# Patient Record
Sex: Female | Born: 1935
Health system: Southern US, Community
[De-identification: ages and names within clinical notes are randomized; demographics above are authoritative.]

## PROBLEM LIST (undated history)

## (undated) DIAGNOSIS — I10 Essential (primary) hypertension: Secondary | ICD-10-CM

## (undated) DIAGNOSIS — E119 Type 2 diabetes mellitus without complications: Secondary | ICD-10-CM

## (undated) DIAGNOSIS — K219 Gastro-esophageal reflux disease without esophagitis: Secondary | ICD-10-CM

## (undated) DIAGNOSIS — M1990 Unspecified osteoarthritis, unspecified site: Secondary | ICD-10-CM

## (undated) DIAGNOSIS — C50919 Malignant neoplasm of unspecified site of unspecified female breast: Secondary | ICD-10-CM

## (undated) DIAGNOSIS — R51 Headache: Secondary | ICD-10-CM

## (undated) DIAGNOSIS — C801 Malignant (primary) neoplasm, unspecified: Secondary | ICD-10-CM

## (undated) HISTORY — PX: ABDOMINAL HYSTERECTOMY: SHX81

## (undated) HISTORY — PX: SMALL INTESTINE SURGERY: SHX150

## (undated) HISTORY — PX: BREAST EXCISIONAL BIOPSY: SUR124

---

## 1979-12-31 DIAGNOSIS — C801 Malignant (primary) neoplasm, unspecified: Secondary | ICD-10-CM

## 1979-12-31 HISTORY — PX: MASTECTOMY: SHX3

## 1979-12-31 HISTORY — DX: Malignant (primary) neoplasm, unspecified: C80.1

## 1998-08-31 ENCOUNTER — Encounter: Payer: Self-pay | Admitting: Surgery

## 1998-08-31 ENCOUNTER — Ambulatory Visit (HOSPITAL_BASED_OUTPATIENT_CLINIC_OR_DEPARTMENT_OTHER): Admission: RE | Admit: 1998-08-31 | Discharge: 1998-08-31 | Payer: Self-pay | Admitting: Surgery

## 1999-10-19 ENCOUNTER — Ambulatory Visit (HOSPITAL_COMMUNITY): Admission: RE | Admit: 1999-10-19 | Discharge: 1999-10-19 | Payer: Self-pay | Admitting: Gastroenterology

## 1999-10-19 ENCOUNTER — Encounter: Payer: Self-pay | Admitting: Gastroenterology

## 2000-10-06 ENCOUNTER — Encounter: Payer: Self-pay | Admitting: Internal Medicine

## 2000-10-06 ENCOUNTER — Encounter: Admission: RE | Admit: 2000-10-06 | Discharge: 2000-10-06 | Payer: Self-pay | Admitting: Unknown Physician Specialty

## 2001-04-15 ENCOUNTER — Encounter: Admission: RE | Admit: 2001-04-15 | Discharge: 2001-07-14 | Payer: Self-pay | Admitting: Internal Medicine

## 2001-10-26 ENCOUNTER — Encounter: Payer: Self-pay | Admitting: Internal Medicine

## 2001-10-26 ENCOUNTER — Encounter: Admission: RE | Admit: 2001-10-26 | Discharge: 2001-10-26 | Payer: Self-pay | Admitting: Internal Medicine

## 2002-11-15 ENCOUNTER — Encounter: Payer: Self-pay | Admitting: Internal Medicine

## 2002-11-15 ENCOUNTER — Encounter: Admission: RE | Admit: 2002-11-15 | Discharge: 2002-11-15 | Payer: Self-pay | Admitting: Internal Medicine

## 2003-02-03 ENCOUNTER — Encounter: Admission: RE | Admit: 2003-02-03 | Discharge: 2003-02-03 | Payer: Self-pay | Admitting: Internal Medicine

## 2003-02-03 ENCOUNTER — Encounter: Payer: Self-pay | Admitting: Internal Medicine

## 2003-06-26 ENCOUNTER — Encounter: Payer: Self-pay | Admitting: Emergency Medicine

## 2003-06-26 ENCOUNTER — Emergency Department (HOSPITAL_COMMUNITY): Admission: EM | Admit: 2003-06-26 | Discharge: 2003-06-26 | Payer: Self-pay | Admitting: Emergency Medicine

## 2003-12-20 ENCOUNTER — Encounter: Admission: RE | Admit: 2003-12-20 | Discharge: 2003-12-20 | Payer: Self-pay | Admitting: Internal Medicine

## 2005-01-01 ENCOUNTER — Encounter: Admission: RE | Admit: 2005-01-01 | Discharge: 2005-01-01 | Payer: Self-pay | Admitting: Internal Medicine

## 2005-05-09 ENCOUNTER — Encounter: Admission: RE | Admit: 2005-05-09 | Discharge: 2005-05-09 | Payer: Self-pay | Admitting: Obstetrics and Gynecology

## 2005-07-04 ENCOUNTER — Ambulatory Visit: Payer: Self-pay | Admitting: Hematology & Oncology

## 2006-01-21 ENCOUNTER — Encounter: Admission: RE | Admit: 2006-01-21 | Discharge: 2006-01-21 | Payer: Self-pay | Admitting: Internal Medicine

## 2007-02-20 ENCOUNTER — Encounter: Admission: RE | Admit: 2007-02-20 | Discharge: 2007-02-20 | Payer: Self-pay | Admitting: Internal Medicine

## 2007-03-04 ENCOUNTER — Encounter: Admission: RE | Admit: 2007-03-04 | Discharge: 2007-03-04 | Payer: Self-pay | Admitting: Internal Medicine

## 2007-05-26 ENCOUNTER — Encounter: Admission: RE | Admit: 2007-05-26 | Discharge: 2007-05-26 | Payer: Self-pay | Admitting: Obstetrics and Gynecology

## 2008-03-18 ENCOUNTER — Encounter: Admission: RE | Admit: 2008-03-18 | Discharge: 2008-03-18 | Payer: Self-pay | Admitting: Internal Medicine

## 2008-06-17 IMAGING — CT CT PELVIS W/ CM
2 of 6 series · 16 of 46 positions shown, 18 images · IV contrast (READICAT/WATER)
Comparison: Digitized [REDACTED] upper G.I. series, 02/03/03, and [REDACTED] MRCP report, 10/21/99.

CLINICAL DATA: Abdominal pain.    Follow-up renal cyst.
ABDOMEN CT WITHOUT AND WITH CONTRAST:
TECHNIQUE: Multidetector CT imaging of the abdomen was performed both before and during bolus administration of intravenous contrast.
Contrast:  100 cc Omnipaque 300
TECHNIQUE: Multidetector CT imaging of the pelvis was performed following the standard protocol during bolus administration of intravenous contrast.

[Series 4: routine abdomen · axial · 0.74mm/px · z∈[-311,+9]mm · 13 of 74 slices shown, 15 images]
[im 6/74  soft-tissue]
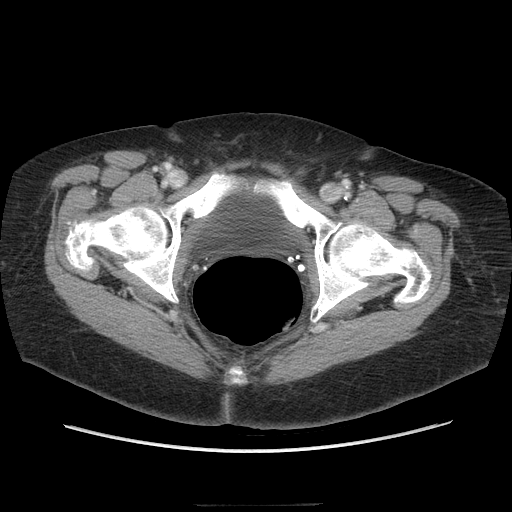
[im 6/74  bone]
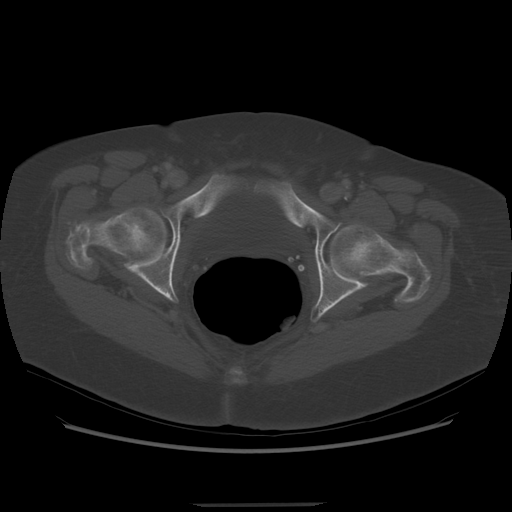
[im 11/74  soft-tissue]
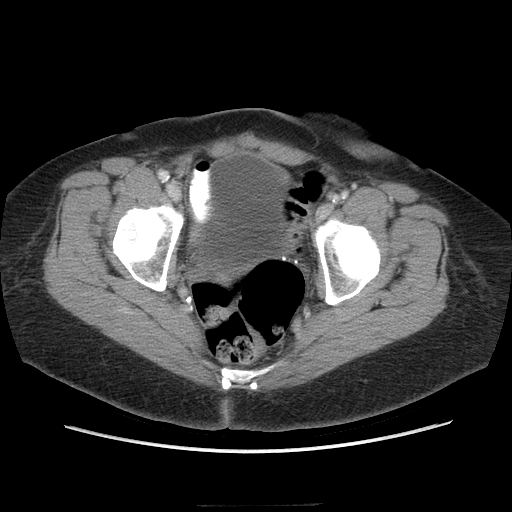
[im 16/74  soft-tissue]
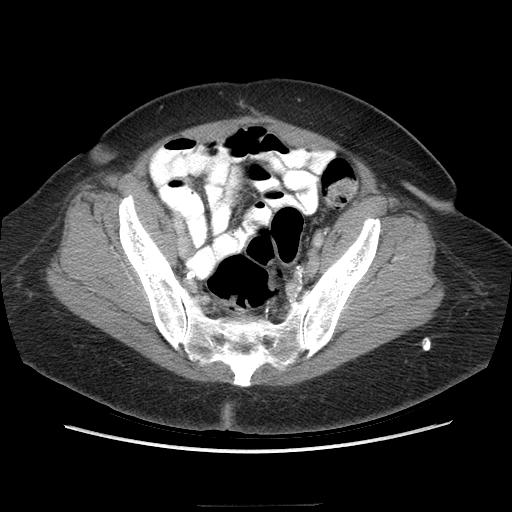
[im 21/74  soft-tissue]
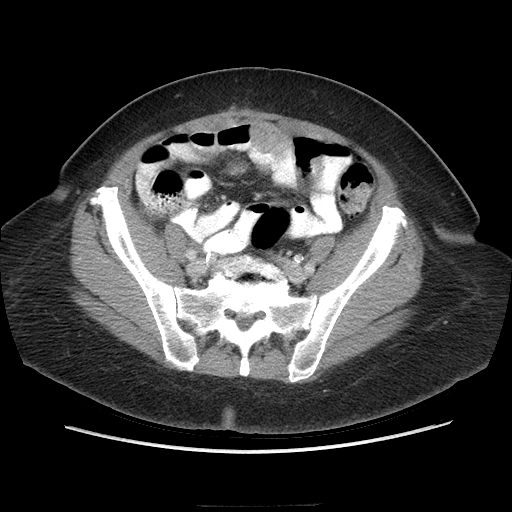
[im 27/74  soft-tissue]
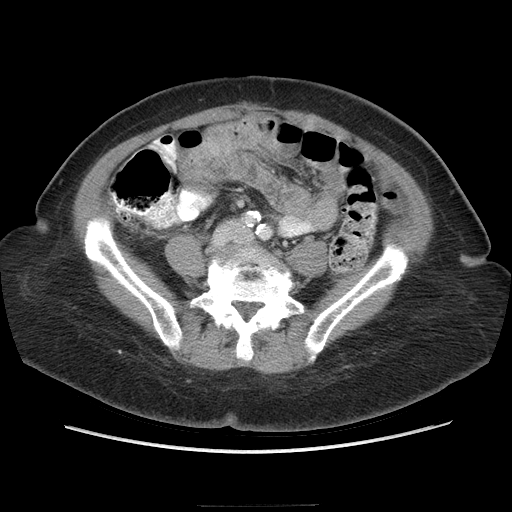
[im 32/74  soft-tissue]
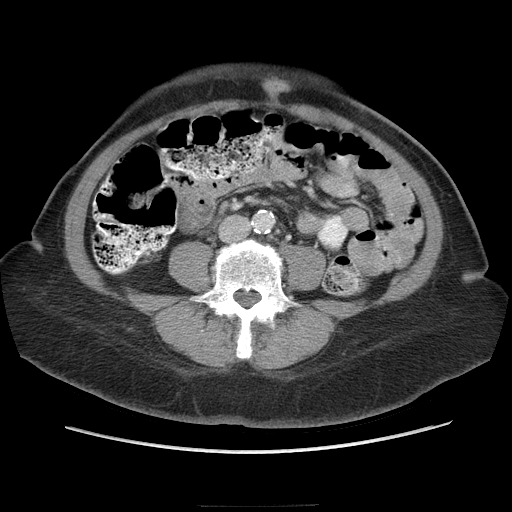
[im 37/74  soft-tissue]
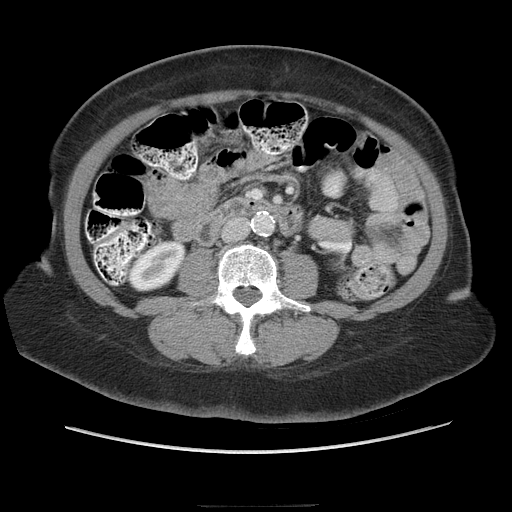
[im 42/74  soft-tissue]
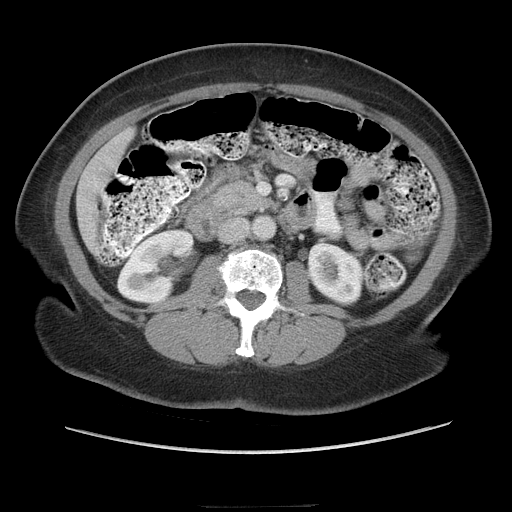
[im 47/74  soft-tissue]
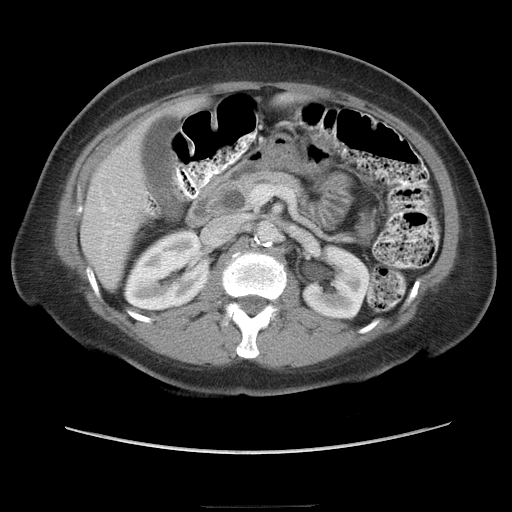
[im 47/74  bone]
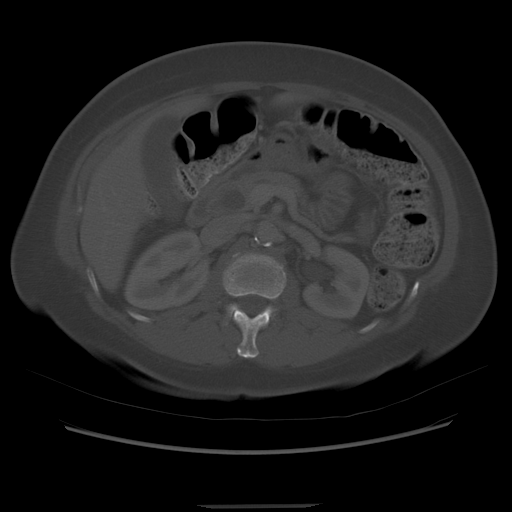
[im 53/74  soft-tissue]
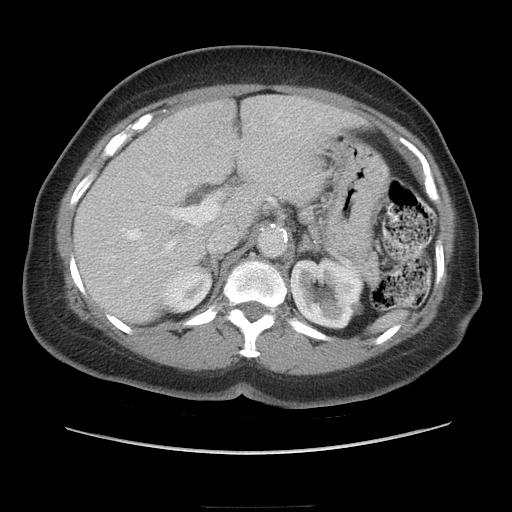
[im 58/74  soft-tissue]
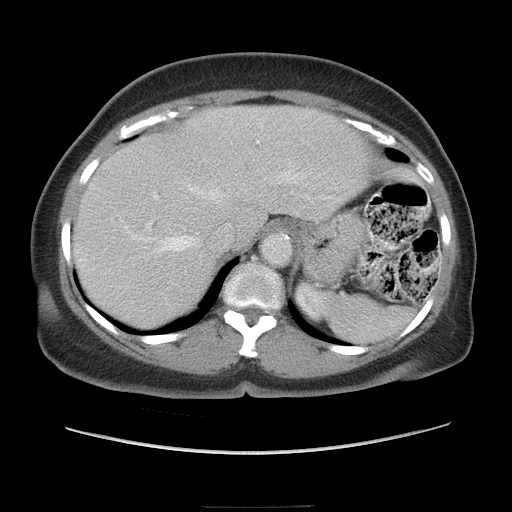
[im 63/74  soft-tissue]
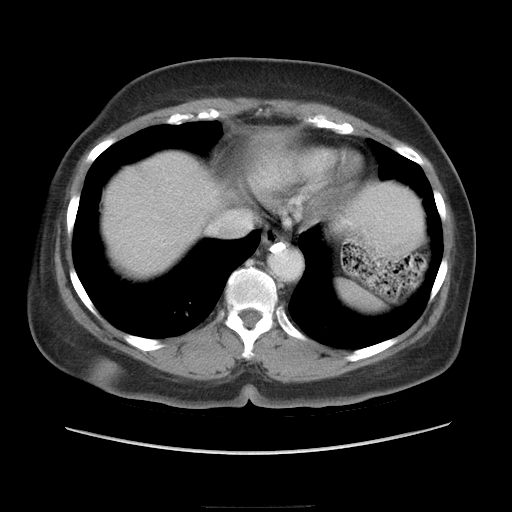
[im 68/74  soft-tissue]
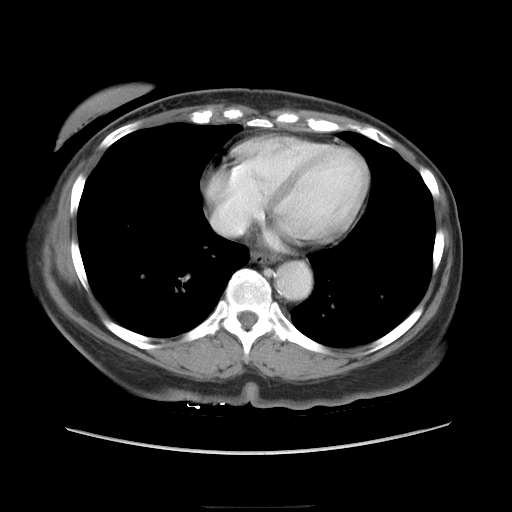

[Series 602: sagittal body · sagittal · 0.79mm/px · 3 of 152 slices shown]
[im 51/152  soft-tissue]
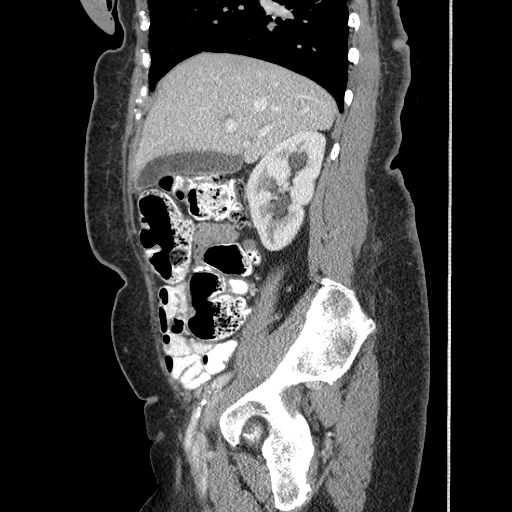
[im 68/152  soft-tissue]
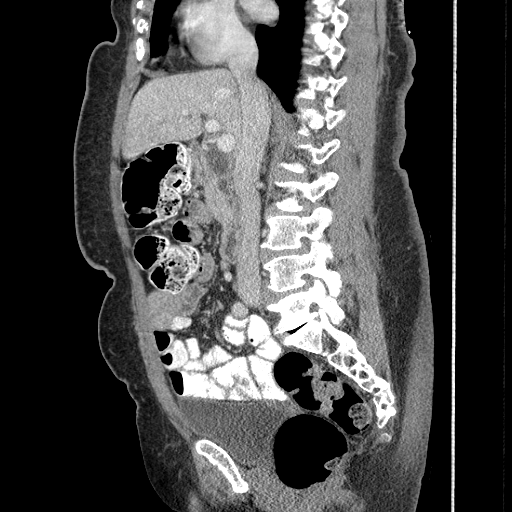
[im 84/152  soft-tissue]
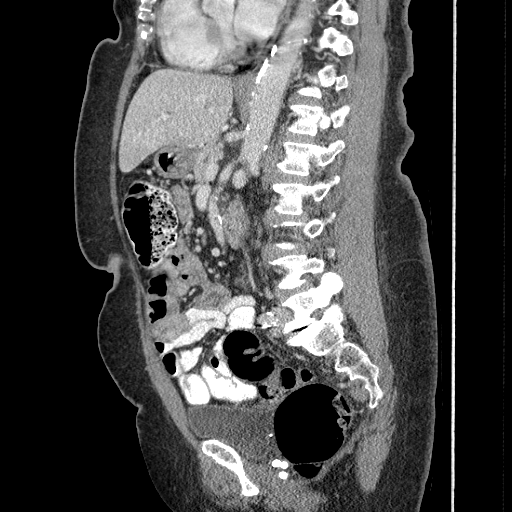

[16 of 46 positions shown; findings below may reference images not displayed]

FINDINGS: appearing multiple left renal cysts--exophytic 10 mm lateral upper to mid, 3 mm upper medial, and 7 mm lower pole left.  Bilateral kidneys are normal in size with no other focal calcification/lesion nor hydronephrosis seen.  Multilevel degenerative disk disease is seen in the lumbar from L2-3 through L5-S1 maximal at latter level.  Slight posterior spondylosis is noted with slight bilateral L5-S1 neural foraminal narrowing.  light fusiform bronchiectasis and linear probable scarring is seen at the right lung base with clustered tiny vasculature or post inflammatory nodules at the right lung base with the lungs otherwise clear.  The patient is post right mastectomy with slight constipation findings without inflammatory bowel disease nor obstruction.  Moderate atheromatous vascular calcification is noted with normal sized abdominal aorta.  The pancreatic duct is slight dilated with no change since prior MRCP report of chronic dilatation of the common hepatic and common bile duct which currently measures up to 18 mm (image 25) and 10 mm AP at its distal course toward the ampulla.  No dilated intrahepatic bile ducts are seen.  The remaining abdominal organs appear normal without inflammation nor adenopathy.
IMPRESSION: 1.  Chronic distention of extrahepatic bile duct with no obstructing lesion identified.
2.  Slight distention pancreatic duct with the pancreas otherwise normal appearing. 
3.  Constipation, slight probable chronic inflammatory changes at the posterior right lung base. 
4.  Atheromatous vascular calcification with degenerative disk disease maximal at L5-S1 with slight bilateral neural foraminal narrowing and moderate facet degenerative joint disease at L5-S1. 
5.  Otherwise negative.
PELVIS CT WITH CONTRAST:
FINDINGS: Moderate atheromatous vascular calcification is seen.  Constipation findings are noted with intestine otherwise unremarkable.  The appendix is not identified with no inflammatory change at the right lower quadrant.  Pessary is in place.  Post hysterectomy status noted.  No free fluid,  inflammation, nor adenopathy is seen.
IMPRESSION: 1.  Post hysterectomy with pessary.
2.  Moderate atheromatous vascular calcification.
3.  Constipation. 
4.  Otherwise negative.

## 2009-03-21 ENCOUNTER — Encounter: Admission: RE | Admit: 2009-03-21 | Discharge: 2009-03-21 | Payer: Self-pay | Admitting: Internal Medicine

## 2010-04-09 ENCOUNTER — Encounter: Admission: RE | Admit: 2010-04-09 | Discharge: 2010-04-09 | Payer: Self-pay | Admitting: Internal Medicine

## 2010-04-16 ENCOUNTER — Encounter: Payer: Self-pay | Admitting: Pulmonary Disease

## 2010-04-23 ENCOUNTER — Encounter: Payer: Self-pay | Admitting: Pulmonary Disease

## 2010-05-08 DIAGNOSIS — K279 Peptic ulcer, site unspecified, unspecified as acute or chronic, without hemorrhage or perforation: Secondary | ICD-10-CM | POA: Insufficient documentation

## 2010-05-08 DIAGNOSIS — C50919 Malignant neoplasm of unspecified site of unspecified female breast: Secondary | ICD-10-CM | POA: Insufficient documentation

## 2010-05-08 DIAGNOSIS — E119 Type 2 diabetes mellitus without complications: Secondary | ICD-10-CM | POA: Insufficient documentation

## 2010-05-08 DIAGNOSIS — M353 Polymyalgia rheumatica: Secondary | ICD-10-CM | POA: Insufficient documentation

## 2010-05-08 DIAGNOSIS — H8110 Benign paroxysmal vertigo, unspecified ear: Secondary | ICD-10-CM | POA: Insufficient documentation

## 2010-05-08 DIAGNOSIS — I10 Essential (primary) hypertension: Secondary | ICD-10-CM | POA: Insufficient documentation

## 2010-05-08 DIAGNOSIS — M1990 Unspecified osteoarthritis, unspecified site: Secondary | ICD-10-CM | POA: Insufficient documentation

## 2010-05-08 DIAGNOSIS — K219 Gastro-esophageal reflux disease without esophagitis: Secondary | ICD-10-CM | POA: Insufficient documentation

## 2010-05-08 DIAGNOSIS — M19019 Primary osteoarthritis, unspecified shoulder: Secondary | ICD-10-CM | POA: Insufficient documentation

## 2010-05-08 DIAGNOSIS — F411 Generalized anxiety disorder: Secondary | ICD-10-CM | POA: Insufficient documentation

## 2010-05-08 DIAGNOSIS — M899 Disorder of bone, unspecified: Secondary | ICD-10-CM | POA: Insufficient documentation

## 2010-05-08 DIAGNOSIS — M949 Disorder of cartilage, unspecified: Secondary | ICD-10-CM

## 2010-05-08 DIAGNOSIS — K2980 Duodenitis without bleeding: Secondary | ICD-10-CM | POA: Insufficient documentation

## 2010-05-08 DIAGNOSIS — N181 Chronic kidney disease, stage 1: Secondary | ICD-10-CM | POA: Insufficient documentation

## 2010-05-08 DIAGNOSIS — Z8719 Personal history of other diseases of the digestive system: Secondary | ICD-10-CM | POA: Insufficient documentation

## 2010-05-09 ENCOUNTER — Ambulatory Visit: Payer: Self-pay | Admitting: Pulmonary Disease

## 2010-05-09 DIAGNOSIS — R059 Cough, unspecified: Secondary | ICD-10-CM | POA: Insufficient documentation

## 2010-05-09 DIAGNOSIS — J449 Chronic obstructive pulmonary disease, unspecified: Secondary | ICD-10-CM | POA: Insufficient documentation

## 2010-05-09 DIAGNOSIS — J4489 Other specified chronic obstructive pulmonary disease: Secondary | ICD-10-CM | POA: Insufficient documentation

## 2010-05-09 DIAGNOSIS — R05 Cough: Secondary | ICD-10-CM

## 2010-05-10 ENCOUNTER — Telehealth (INDEPENDENT_AMBULATORY_CARE_PROVIDER_SITE_OTHER): Payer: Self-pay | Admitting: *Deleted

## 2010-05-10 ENCOUNTER — Telehealth: Payer: Self-pay | Admitting: Pulmonary Disease

## 2010-05-11 ENCOUNTER — Ambulatory Visit: Payer: Self-pay | Admitting: Pulmonary Disease

## 2011-01-29 NOTE — Assessment & Plan Note (Signed)
Summary: persistant [name]   Visit Type:  Initial Consult Copy to:  pcp Primary Provider/Referring Provider:  Dr. Arther Dames  CC:  Pt here for pulmonary consult. Pt c/o having Victoria cough starting August 2010 and stopped x 2 weeks ago. Pt states has been off of Losartan/HCTZ x 4 months.  History of Present Illness: 77/F ex smoker for evaluation of chronic cough. 7/10 quit smoking with gum & patches cough since, productive of 'soap suds', no obvious heartburn, no seasonal or diurnal variation, has subsided over last 2 weeks tessalon perles  Lisinopril changed to losartan in the past. Spirometry >> nml lung function  Preventive Screening-Counseling & Management  Alcohol-Tobacco     Smoking Status: quit     Packs/Day: 1.0     Year Started: 1953     Year Quit: 2010  Current Medications (verified): 1)  Metformin Hcl 500 Mg Tabs (Metformin Hcl) .... Take 1 Tablet By Mouth Two Times A Day 2)  Oysco 500+d 500-200 Mg-Unit Tabs (Calcium Carbonate-Vitamin D) .... Take 1 Tablet By Mouth Once A Day 3)  Xanax 0.5 Mg Tabs (Alprazolam) .... Take 1 Tablet By Mouth Two Times A Day As Needed 4)  Vicodin 5-500 Mg Tabs (Hydrocodone-Acetaminophen) .... Take 1 Tablet By Mouth Two Times A Day As Needed 5)  Felodipine 10 Mg Xr24h-Tab (Felodipine) .... Take 1 Tablet By Mouth Once A Day 6)  Hydrochlorothiazide 25 Mg Tabs (Hydrochlorothiazide) .... Take 1/2 Tablet By Mouth Every Morning 7)  Vitamin D3 5000 Units .... Take 1 Tablet By Mouth Once A Day 8)  Aspirin 81 Mg  Tabs (Aspirin) .... Take 1 Tablet By Mouth Once A Day 9)  Multivitamins   Tabs (Multiple Vitamin) .... Take 1 Tablet By Mouth Once A Day  Allergies (verified): 1)  ! Lisinopril 2)  ! Pcn  Past History:  Past Medical History: Last updated: 2010-06-01 CHRONIC KIDNEY DISEASE STAGE I (ICD-585.1) DEGENERATIVE JOINT DISEASE, BACK (ICD-715.98) OSTEOPENIA (ICD-733.90) PUD (ICD-533.90) BENIGN POSITIONAL VERTIGO  (ICD-386.11) OSTEOARTHRITIS, SHOULDER (ICD-715.91) DIVERTICULITIS, HX OF (ICD-V12.79) ANXIETY (ICD-300.00) DUODENITIS (ICD-535.60) POLYMYALGIA RHEUMATICA (ICD-725) Hx of BREAST CANCER (ICD-174.9) HYPERTENSION (ICD-401.9) G E R D (ICD-530.81) DIABETES, TYPE 2 (ICD-250.00)    Past Surgical History: Last updated: Jun 01, 2010 Mastectomy-Right 1986 Total Abdominal Hysterectomy Cataract Extraction * ADHESION WITH PARTIAL COLOECTOMY  Family History: Last updated: 2010-06-01 unknown Mother: deceased 43 yrs old adopted Pt adopted no siblings, no known family  Social History: Last updated: 05/09/2010 Marital Status: Single, divorced Children: 2 adopted children Occupation: unemployeed, Agricultural consultant at [employer] Patient states former smoker. 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 (former 1ppd) Packs/Day:  1.0  Review of Systems       The patient complains of Victoria cough, irregular heartbeats, acid heartburn, and joint stiffness or pain.  The patient denies shortness of breath with activity, shortness of breath at rest, productive cough, coughing up blood, chest pain, indigestion, loss of appetite, weight change, abdominal pain, difficulty swallowing, sore throat, tooth/dental problems, headaches, nasal congestion/difficulty breathing through nose, sneezing, itching, ear ache, anxiety, depression, hand/feet swelling, rash, change in color of mucus, and fever.    Vital Signs:  Patient profile:   77 year old female Height:      64 inches Weight:      182 pounds BMI:     31.35 O2 Sat:      94 % on Room air Temp:     98.5  degrees F oral Pulse rate:   92 / minute BP sitting:   120 / 80  (left arm) Cuff size:   large  Vitals Entered By: Zackery Barefoot CMA (May 09, 2010 3:30 PM)  O2 Flow:  Room air CC: Pt here for pulmonary consult. Pt c/o having  Victoria cough starting August 2010 and stopped x 2 weeks ago. Pt states has been off of Losartan/HCTZ x 4 months Comments Medications reviewed with patient Verified contact number and pharmacy with patient Zackery Barefoot CMA  May 09, 2010 3:30 PM    Physical Exam  Additional Exam:  Gen. Pleasant, well-nourished, in no distress, normal affect ENT - no lesions, no post nasal drip Neck: No JVD, no thyromegaly, no carotid bruits Lungs: no use of accessory muscles, no dullness to percussion, clear without rales or rhonchi  Cardiovascular: Rhythm regular, heart sounds  normal, no murmurs or gallops, no peripheral edema Abdomen: soft and non-tender, no hepatosplenomegaly, BS normal. Musculoskeletal: No deformities, no cyanosis or clubbing Neuro:  alert, non focal     CXR  Procedure date:  05/09/2010  Findings:      IMPRESSION: Probable nipple shadow projecting over the left costophrenic angle. If desired, repeat exam with nipple marker could be performed.  Pulmonary Function Test Date: 05/09/2010 4:06 PM Gender: Female  Pre-Spirometry FVC    Value: 2.14 L/min   % Pred: 106.90 % FEV1    Value: 1.57 L     Pred: 1.54 L     % Pred: 102.10 % FEV1/FVC  Value: 73.63 %     % Pred: 97.70 %  Impression & Recommendations:  Problem # 1:  COUGH (ICD-786.2) resolved Orders: T-2 View CXR (71020TC) Consultation Level III (16109) Spirometry w/Graph (94010)  Problem # 2:  C O P D (ICD-496) No need for bronchodilators given nml lung function Rpt CXR with nipple markers  Medications Added to Medication List This Visit: 1)  Aspirin 81 Mg Tabs (Aspirin) .... Take 1 tablet by mouth once a day 2)  Multivitamins Tabs (Multiple vitamin) .... Take 1 tablet by mouth once a day  Patient Instructions: 1)  Copy sent to: Dr Donette Larry 2)  Breathing test 3)  A chest x-ray has been recommended.  Your imaging study may require preauthorization.    Immunization History:  Influenza  Immunization History:    Influenza:  historical (10/02/2009)  Pneumovax Immunization History:    Pneumovax:  historical (12/31/2007)     CardioPerfect Spirometry  ID: [id number] Patient: AYERIM, BERQUIST DOB: [birth year] Age: 77 Years Old Sex: Female Race: Black Height: 64 Weight: 182 PPD: 1.0 Status: Unconfirmed Past Medical History:  CHRONIC KIDNEY DISEASE STAGE I (ICD-585.1) DEGENERATIVE JOINT DISEASE, BACK (ICD-715.98) OSTEOPENIA (ICD-733.90) PUD (ICD-533.90) BENIGN POSITIONAL VERTIGO (ICD-386.11) OSTEOARTHRITIS, SHOULDER (ICD-715.91) DIVERTICULITIS, HX OF (ICD-V12.79) ANXIETY (ICD-300.00) DUODENITIS (ICD-535.60) POLYMYALGIA RHEUMATICA (ICD-725) Hx of BREAST CANCER (ICD-174.9) HYPERTENSION (ICD-401.9) G E R D (ICD-530.81) DIABETES, TYPE 2 (ICD-250.00)   Recorded: 05/09/2010 4:06 PM  Parameter  Measured Predicted %Predicted FVC     2.14        2.00        106.90 FEV1     1.57        1.54        102.10 FEV1%   73.63        75.34        97.70 PEF    1.87        3.89        48.20  Interpretation: Normal spirometry

## 2011-01-29 NOTE — Progress Notes (Signed)
Summary: returned call  Phone Note Call from Patient Call back at Home Phone [phone]   Caller: Patient Call For: alva Summary of Call: pt returned call from [place] today. Shanda Bumps r isn't here today).  Initial call taken by: Tivis Ringer, CNA,  May 10, 2010 11:54 AM  Follow-up for Phone Call        Pt informed of Dr Reginia Naas recommendations to repeat cxr with nipple markers.  Pt coming in today to repeat cxr.  Order put in EMR and IDX. Abigail Miyamoto RN  May 10, 2010 12:29 PM

## 2011-01-29 NOTE — Letter (Signed)
Summary: Tyson Dense MD/[facility] Volanda Napoleon MD/[facility]   Imported By: Lester [place] 05/16/2010 10:28:22  _____________________________________________________________________  External Attachment:    Type:   Image     Comment:   External Document

## 2011-01-29 NOTE — Letter (Signed)
Summary: Tyson Dense MD/[facility] Napoleon MD/[facility]   Imported By: Lester [place] 05/16/2010 10:26:19  _____________________________________________________________________  External Attachment:    Type:   Image     Comment:   External Document

## 2011-01-29 NOTE — Progress Notes (Signed)
Summary: repeat xray  Phone Note Call from Patient Call back at Home Phone [phone]   Caller: Patient Call For: alva Reason for Call: Talk to Nurse Complaint: Breathing Problems Summary of Call: Patient asking to speak to nurse about repeat xray scheduled for today.   Initial call taken by: Lehman Prom,  May 10, 2010 12:54 PM  Follow-up for Phone Call        PT thinks that shadow on cxr may be from previous surgery on left breast.  Pt is concerned about having too many xrays.  Please advise if pt needs to procede with repeat cxr.  Informed that pt that Dr Vassie Loll was back in office on 05-11-10. Abigail Miyamoto RN  May 10, 2010 1:03 PM   Pt called back.  Still waiting on phone call from nurse.  Informed pt again that RA not in until this afternoon and would address then.  Pt stated she understood. Follow-up by: Eugene Gavia,  May 11, 2010 10:49 AM  Additional Follow-up for Phone Call Additional follow up Details #1::        Better to take rpt xray to clarify - likley nipple shadow, but rpt xray with markers only way to conform not real spot Additional Follow-up by: Comer Locket. Vassie Loll MD,  May 11, 2010 11:13 AM    Additional Follow-up for Phone Call Additional follow up Details #2::    pt states she will come in today for cxr. order placed. Carron Curie CMA  May 11, 2010 11:18 AM

## 2011-03-15 ENCOUNTER — Other Ambulatory Visit: Payer: Self-pay | Admitting: Internal Medicine

## 2011-03-15 DIAGNOSIS — Z9010 Acquired absence of unspecified breast and nipple: Secondary | ICD-10-CM

## 2011-04-15 ENCOUNTER — Ambulatory Visit
Admission: RE | Admit: 2011-04-15 | Discharge: 2011-04-15 | Disposition: A | Discharge status: Home / Self Care | Payer: Medicare Other | Source: Ambulatory Visit | Attending: Internal Medicine | Admitting: Internal Medicine

## 2011-04-15 DIAGNOSIS — Z9010 Acquired absence of unspecified breast and nipple: Secondary | ICD-10-CM

## 2012-03-13 ENCOUNTER — Other Ambulatory Visit: Payer: Self-pay | Admitting: Internal Medicine

## 2012-03-13 DIAGNOSIS — Z9011 Acquired absence of right breast and nipple: Secondary | ICD-10-CM

## 2012-03-13 DIAGNOSIS — Z1231 Encounter for screening mammogram for malignant neoplasm of breast: Secondary | ICD-10-CM

## 2012-04-15 ENCOUNTER — Ambulatory Visit
Admission: RE | Admit: 2012-04-15 | Discharge: 2012-04-15 | Disposition: A | Discharge status: Home / Self Care | Payer: Medicare Other | Source: Ambulatory Visit | Attending: Internal Medicine | Admitting: Internal Medicine

## 2012-04-15 DIAGNOSIS — Z1231 Encounter for screening mammogram for malignant neoplasm of breast: Secondary | ICD-10-CM

## 2012-04-15 DIAGNOSIS — Z9011 Acquired absence of right breast and nipple: Secondary | ICD-10-CM

## 2013-03-29 ENCOUNTER — Other Ambulatory Visit: Payer: Self-pay

## 2013-03-29 DIAGNOSIS — Z9011 Acquired absence of right breast and nipple: Secondary | ICD-10-CM

## 2013-03-29 DIAGNOSIS — Z1231 Encounter for screening mammogram for malignant neoplasm of breast: Secondary | ICD-10-CM

## 2013-04-29 ENCOUNTER — Ambulatory Visit
Admission: RE | Admit: 2013-04-29 | Discharge: 2013-04-29 | Disposition: A | Discharge status: Home / Self Care | Payer: Medicare Other | Source: Ambulatory Visit

## 2013-04-29 DIAGNOSIS — Z1231 Encounter for screening mammogram for malignant neoplasm of breast: Secondary | ICD-10-CM

## 2013-04-29 DIAGNOSIS — Z9011 Acquired absence of right breast and nipple: Secondary | ICD-10-CM

## 2014-03-23 ENCOUNTER — Other Ambulatory Visit: Payer: Self-pay

## 2014-03-23 DIAGNOSIS — Z1231 Encounter for screening mammogram for malignant neoplasm of breast: Secondary | ICD-10-CM

## 2014-03-23 DIAGNOSIS — Z9011 Acquired absence of right breast and nipple: Secondary | ICD-10-CM

## 2014-05-02 ENCOUNTER — Ambulatory Visit
Admission: RE | Admit: 2014-05-02 | Discharge: 2014-05-02 | Disposition: A | Discharge status: Home / Self Care | Payer: Medicare Other | Source: Ambulatory Visit

## 2014-05-02 ENCOUNTER — Encounter (INDEPENDENT_AMBULATORY_CARE_PROVIDER_SITE_OTHER): Payer: Self-pay

## 2014-05-02 DIAGNOSIS — Z1231 Encounter for screening mammogram for malignant neoplasm of breast: Secondary | ICD-10-CM

## 2014-05-02 DIAGNOSIS — Z9011 Acquired absence of right breast and nipple: Secondary | ICD-10-CM

## 2014-07-18 ENCOUNTER — Other Ambulatory Visit: Payer: Self-pay | Admitting: Gastroenterology

## 2014-08-02 ENCOUNTER — Encounter (HOSPITAL_COMMUNITY): Payer: Self-pay | Admitting: Pharmacy Technician

## 2014-08-05 ENCOUNTER — Encounter (HOSPITAL_COMMUNITY): Payer: Self-pay | Admitting: *Deleted

## 2014-08-22 ENCOUNTER — Encounter (HOSPITAL_COMMUNITY)
Admission: RE | Disposition: A | Discharge status: Home / Self Care | Payer: Self-pay | Source: Ambulatory Visit | Attending: Gastroenterology

## 2014-08-22 ENCOUNTER — Encounter (HOSPITAL_COMMUNITY): Payer: Self-pay | Admitting: *Deleted

## 2014-08-22 ENCOUNTER — Ambulatory Visit (HOSPITAL_COMMUNITY)
Admission: RE | Admit: 2014-08-22 | Discharge: 2014-08-22 | Disposition: A | Discharge status: Home / Self Care | Payer: Medicare Other | Source: Ambulatory Visit | Attending: Gastroenterology | Admitting: Gastroenterology

## 2014-08-22 ENCOUNTER — Encounter (HOSPITAL_COMMUNITY): Payer: Medicare Other | Admitting: Anesthesiology

## 2014-08-22 ENCOUNTER — Ambulatory Visit (HOSPITAL_COMMUNITY): Payer: Medicare Other | Admitting: Anesthesiology

## 2014-08-22 DIAGNOSIS — K5900 Constipation, unspecified: Secondary | ICD-10-CM | POA: Diagnosis not present

## 2014-08-22 DIAGNOSIS — E119 Type 2 diabetes mellitus without complications: Secondary | ICD-10-CM | POA: Diagnosis not present

## 2014-08-22 DIAGNOSIS — M353 Polymyalgia rheumatica: Secondary | ICD-10-CM | POA: Insufficient documentation

## 2014-08-22 DIAGNOSIS — K5730 Diverticulosis of large intestine without perforation or abscess without bleeding: Secondary | ICD-10-CM | POA: Diagnosis not present

## 2014-08-22 DIAGNOSIS — I10 Essential (primary) hypertension: Secondary | ICD-10-CM | POA: Insufficient documentation

## 2014-08-22 DIAGNOSIS — Z888 Allergy status to other drugs, medicaments and biological substances status: Secondary | ICD-10-CM | POA: Insufficient documentation

## 2014-08-22 DIAGNOSIS — Z880 Allergy status to penicillin: Secondary | ICD-10-CM | POA: Insufficient documentation

## 2014-08-22 DIAGNOSIS — Z853 Personal history of malignant neoplasm of breast: Secondary | ICD-10-CM | POA: Diagnosis not present

## 2014-08-22 DIAGNOSIS — Z87891 Personal history of nicotine dependence: Secondary | ICD-10-CM | POA: Diagnosis not present

## 2014-08-22 DIAGNOSIS — K219 Gastro-esophageal reflux disease without esophagitis: Secondary | ICD-10-CM | POA: Diagnosis not present

## 2014-08-22 DIAGNOSIS — K279 Peptic ulcer, site unspecified, unspecified as acute or chronic, without hemorrhage or perforation: Secondary | ICD-10-CM | POA: Diagnosis not present

## 2014-08-22 DIAGNOSIS — K648 Other hemorrhoids: Secondary | ICD-10-CM | POA: Diagnosis not present

## 2014-08-22 DIAGNOSIS — Z1211 Encounter for screening for malignant neoplasm of colon: Secondary | ICD-10-CM | POA: Diagnosis present

## 2014-08-22 HISTORY — DX: Essential (primary) hypertension: I10

## 2014-08-22 HISTORY — DX: Malignant (primary) neoplasm, unspecified: C80.1

## 2014-08-22 HISTORY — DX: Unspecified osteoarthritis, unspecified site: M19.90

## 2014-08-22 HISTORY — DX: Type 2 diabetes mellitus without complications: E11.9

## 2014-08-22 HISTORY — DX: Headache: R51

## 2014-08-22 HISTORY — PX: COLONOSCOPY WITH PROPOFOL: SHX5780

## 2014-08-22 HISTORY — DX: Gastro-esophageal reflux disease without esophagitis: K21.9

## 2014-08-22 LAB — GLUCOSE, CAPILLARY: Glucose-Capillary: 82 mg/dL (ref 70–99)

## 2014-08-22 SURGERY — COLONOSCOPY WITH PROPOFOL
Anesthesia: Monitor Anesthesia Care

## 2014-08-22 MED ORDER — PROPOFOL 10 MG/ML IV BOLUS
INTRAVENOUS | Status: DC | PRN
  Administered 2014-08-22 (×2): 40 mg via INTRAVENOUS

## 2014-08-22 MED ORDER — PROPOFOL 10 MG/ML IV BOLUS
INTRAVENOUS | Status: AC
  Filled 2014-08-22: qty 20

## 2014-08-22 MED ORDER — LACTATED RINGERS IV SOLN
INTRAVENOUS | Status: DC | PRN
  Administered 2014-08-22: 13:00:00 via INTRAVENOUS

## 2014-08-22 MED ORDER — LACTATED RINGERS IV SOLN
INTRAVENOUS | Status: DC
  Administered 2014-08-22: 1000 mL via INTRAVENOUS

## 2014-08-22 MED ORDER — SODIUM CHLORIDE 0.9 % IV SOLN: INTRAVENOUS | Status: DC

## 2014-08-22 MED ORDER — PROPOFOL INFUSION 10 MG/ML OPTIME
INTRAVENOUS | Status: DC | PRN
  Administered 2014-08-22: 75 ug/kg/min via INTRAVENOUS

## 2014-08-22 SURGICAL SUPPLY — 21 items

## 2014-08-22 NOTE — Anesthesia Postprocedure Evaluation (Signed)
  Anesthesia Post-op Note  Patient: Victoria [name]  Procedure(s) Performed: Procedure(s) (LRB): COLONOSCOPY WITH PROPOFOL (N/A)  Patient Location: PACU  Anesthesia Type: MAC  Level of Consciousness: awake and alert   Airway and Oxygen Therapy: Patient Spontanous Breathing  Post-op Pain: mild  Post-op Assessment: Post-op Vital signs reviewed, Patient's Cardiovascular Status Stable, Respiratory Function Stable, Patent Airway and No signs of Nausea or vomiting  Last Vitals:  Filed Vitals:   08/22/14 1410  BP: 159/84  Pulse: 71  Temp:   Resp: 14    Post-op Vital Signs: stable   Complications: No apparent anesthesia complications

## 2014-08-22 NOTE — Discharge Instructions (Signed)
Colonoscopy, Care After °These instructions give you information on caring for yourself after your procedure. Your doctor may also give you more specific instructions. Call your doctor if you have any problems or questions after your procedure. °HOME CARE °· Do not drive for 24 hours. °· Do not sign important papers or use machinery for 24 hours. °· You may shower. °· You may go back to your usual activities, but go slower for the first 24 hours. °· Take rest breaks often during the first 24 hours. °· Walk around or use warm packs on your belly (abdomen) if you have belly cramping or gas. °· Drink enough fluids to keep your pee (urine) clear or pale yellow. °· Resume your normal diet. Avoid heavy or fried foods. °· Avoid drinking alcohol for 24 hours or as told by your doctor. °· Only take medicines as told by your doctor. °If a tissue sample (biopsy) was taken during the procedure:  °· Do not take aspirin or blood thinners for 7 days, or as told by your doctor. °· Do not drink alcohol for 7 days, or as told by your doctor. °· Eat soft foods for the first 24 hours. °GET HELP IF: °You still have a small amount of blood in your poop (stool) 2-3 days after the procedure. °GET HELP RIGHT AWAY IF: °· You have more than a small amount of blood in your poop. °· You see clumps of tissue (blood clots) in your poop. °· Your belly is puffy (swollen). °· You feel sick to your stomach (nauseous) or throw up (vomit). °· You have a fever. °· You have belly pain that gets worse and medicine does not help. °MAKE SURE YOU: °· Understand these instructions. °· Will watch your condition. °· Will get help right away if you are not doing well or get worse. °Document Released: 01/18/2011 Document Revised: 12/21/2013 Document Reviewed: 08/23/2013 °ExitCare® Patient Information ©2015 [employer]. This information is not intended to replace advice given to you by your health care provider. Make sure you discuss any questions you have with  your health care provider. ° °

## 2014-08-22 NOTE — Anesthesia Preprocedure Evaluation (Addendum)
Anesthesia Evaluation  Patient identified by MRN, date of birth, ID band Patient awake    Reviewed: Allergy & Precautions, H&P , NPO status , Patient's Chart, lab work & pertinent test results  Airway Mallampati: II TM Distance: >3 FB Neck ROM: full    Dental  (+) Chipped, Dental Advisory Given Right upper front chipped badly:   Pulmonary COPDformer smoker,  breath sounds clear to auscultation  Pulmonary exam normal       Cardiovascular Exercise Tolerance: Good hypertension, negative cardio ROS  Rhythm:regular Rate:Normal     Neuro/Psych negative neurological ROS  negative psych ROS   GI/Hepatic negative GI ROS, Neg liver ROS, PUD, GERD-  Medicated and Controlled,  Endo/Other  diabetes, Well Controlled, Type 2, Oral Hypoglycemic Agents  Renal/GU Renal diseaseStage 1 kidney disease  negative genitourinary   Musculoskeletal   Abdominal   Peds  Hematology negative hematology ROS (+)   Anesthesia Other Findings   Reproductive/Obstetrics negative OB ROS                          Anesthesia Physical Anesthesia Plan  ASA: III  Anesthesia Plan: MAC   Post-op Pain Management:    Induction:   Airway Management Planned:   Additional Equipment:   Intra-op Plan:   Post-operative Plan:   Informed Consent: I have reviewed the patients History and Physical, chart, labs and discussed the procedure including the risks, benefits and alternatives for the proposed anesthesia with the patient or authorized representative who has indicated his/her understanding and acceptance.   Dental Advisory Given  Plan Discussed with: CRNA and Surgeon  Anesthesia Plan Comments:         Anesthesia Quick Evaluation

## 2014-08-22 NOTE — Op Note (Signed)
Problem: Hemoccult-positive stool associated with normal hemoglobin. Normal screening colonoscopy performed 07/04/2004  Endoscopist: Earle Gell  Premedication: Propofol administered by anesthesia  Procedure: Diagnostic colonoscopy The patient was placed in the left lateral decubitus position. Anal inspection and digital rectal exam were normal. The Pentax pediatric colonoscope was introduced into the rectum and advanced to the cecum. A normal-appearing appendiceal orifice and ileocecal valve were identified. Colonic preparation for the exam today was good.  The patient has universal colonic diverticulosis  Rectum. Normal. Retroflexed view of the distal rectum showed moderate sized nonbleeding  Sigmoid colon and descending colon. Normal  Splenic flexure. Normal  Transverse colon. Normal  Hepatic flexure. Normal  Ascending colon. Normal  Cecum and ileocecal valve. Normal  Assessment:  #1. Universal colonic diverticulosis  #2. Moderate sized nonbleeding internal hemorrhoids  #3. Otherwise normal diagnostic colonoscopy

## 2014-08-22 NOTE — Transfer of Care (Signed)
Immediate Anesthesia Transfer of Care Note  Patient: Victoria [name]  Procedure(s) Performed: Procedure(s) (LRB): COLONOSCOPY WITH PROPOFOL (N/A)  Patient Location: PACU  Anesthesia Type: MAC  Level of Consciousness: sedated, patient cooperative and responds to stimulation  Airway & Oxygen Therapy: Patient Spontanous Breathing and Patient connected to face mask oxgen  Post-op Assessment: Report given to PACU RN and Post -op Vital signs reviewed and stable  Post vital signs: Reviewed and stable  Complications: No apparent anesthesia complications

## 2014-08-22 NOTE — H&P (Signed)
  Problem: Heme positive stool. Normal CBC performed on 05/10/2014. Normal screening colonoscopy performed on 7/6 last 2005  History: The patient is an 80 year old female born June 29, 1927. She submitted stool for Hemoccult testing as part of her health maintenance physical exam. Her stool returned heme positive. Her hemoglobin was normal. She reports no gastrointestinal bleeding or abdominal pain. She takes MiraLAX to control her chronic constipation.  The patient is scheduled to undergo diagnostic colonoscopy.  Past medical history: Type 2 diabetes mellitus. Hypertension. Breast cancer surgery in 19 6. Polymyalgia rheumatica. Gastroesophageal reflux. Colonic diverticulosis. Osteoarthritis. Peptic ulcer disease. Osteopenia. Stage to chronic kidney disease. Total abdominal hysterectomy. Cataract surgery. Adhesion lysis with partial colectomy.  Medication allergies: Lisinopril causes cough. Penicillin causes cough. Felodipine causes cough.  Exam: The patient is alert and lying comfortably on the endoscopy stretcher. Lungs are clear to auscultation. Cardiac exam reveals a regular rhythm. Abdomen is soft and nontender to palpation.  Plan: Proceed with diagnostic colonoscopy

## 2014-08-23 ENCOUNTER — Encounter (HOSPITAL_COMMUNITY): Payer: Self-pay | Admitting: Gastroenterology

## 2015-03-10 ENCOUNTER — Other Ambulatory Visit: Payer: Self-pay | Admitting: Internal Medicine

## 2015-03-10 DIAGNOSIS — R0989 Other specified symptoms and signs involving the circulatory and respiratory systems: Secondary | ICD-10-CM

## 2015-03-14 ENCOUNTER — Ambulatory Visit
Admission: RE | Admit: 2015-03-14 | Discharge: 2015-03-14 | Disposition: A | Discharge status: Home / Self Care | Payer: Medicare Other | Source: Ambulatory Visit | Attending: Internal Medicine | Admitting: Internal Medicine

## 2015-03-14 DIAGNOSIS — R0989 Other specified symptoms and signs involving the circulatory and respiratory systems: Secondary | ICD-10-CM

## 2015-07-18 ENCOUNTER — Other Ambulatory Visit: Payer: Self-pay | Admitting: Internal Medicine

## 2015-07-18 DIAGNOSIS — Z1231 Encounter for screening mammogram for malignant neoplasm of breast: Secondary | ICD-10-CM

## 2015-07-27 ENCOUNTER — Ambulatory Visit
Admission: RE | Admit: 2015-07-27 | Discharge: 2015-07-27 | Disposition: A | Discharge status: Home / Self Care | Payer: Medicare Other | Source: Ambulatory Visit | Attending: Internal Medicine | Admitting: Internal Medicine

## 2015-07-27 DIAGNOSIS — Z1231 Encounter for screening mammogram for malignant neoplasm of breast: Secondary | ICD-10-CM

## 2015-12-26 ENCOUNTER — Other Ambulatory Visit: Payer: Self-pay | Admitting: Internal Medicine

## 2015-12-26 ENCOUNTER — Ambulatory Visit
Admission: RE | Admit: 2015-12-26 | Discharge: 2015-12-26 | Disposition: A | Discharge status: Home / Self Care | Payer: Medicare Other | Source: Ambulatory Visit | Attending: Internal Medicine | Admitting: Internal Medicine

## 2015-12-26 DIAGNOSIS — S93401A Sprain of unspecified ligament of right ankle, initial encounter: Secondary | ICD-10-CM

## 2016-03-22 DIAGNOSIS — E1122 Type 2 diabetes mellitus with diabetic chronic kidney disease: Secondary | ICD-10-CM | POA: Diagnosis not present

## 2016-03-22 DIAGNOSIS — Z7984 Long term (current) use of oral hypoglycemic drugs: Secondary | ICD-10-CM | POA: Diagnosis not present

## 2016-03-22 DIAGNOSIS — R413 Other amnesia: Secondary | ICD-10-CM | POA: Diagnosis not present

## 2016-06-05 DIAGNOSIS — Z0000 Encounter for general adult medical examination without abnormal findings: Secondary | ICD-10-CM | POA: Diagnosis not present

## 2016-06-05 DIAGNOSIS — K279 Peptic ulcer, site unspecified, unspecified as acute or chronic, without hemorrhage or perforation: Secondary | ICD-10-CM | POA: Diagnosis not present

## 2016-06-05 DIAGNOSIS — I10 Essential (primary) hypertension: Secondary | ICD-10-CM | POA: Diagnosis not present

## 2016-06-05 DIAGNOSIS — F419 Anxiety disorder, unspecified: Secondary | ICD-10-CM | POA: Diagnosis not present

## 2016-06-05 DIAGNOSIS — M5136 Other intervertebral disc degeneration, lumbar region: Secondary | ICD-10-CM | POA: Diagnosis not present

## 2016-06-05 DIAGNOSIS — C50911 Malignant neoplasm of unspecified site of right female breast: Secondary | ICD-10-CM | POA: Diagnosis not present

## 2016-06-05 DIAGNOSIS — N182 Chronic kidney disease, stage 2 (mild): Secondary | ICD-10-CM | POA: Diagnosis not present

## 2016-06-05 DIAGNOSIS — M810 Age-related osteoporosis without current pathological fracture: Secondary | ICD-10-CM | POA: Diagnosis not present

## 2016-06-05 DIAGNOSIS — E1122 Type 2 diabetes mellitus with diabetic chronic kidney disease: Secondary | ICD-10-CM | POA: Diagnosis not present

## 2016-06-05 DIAGNOSIS — M353 Polymyalgia rheumatica: Secondary | ICD-10-CM | POA: Diagnosis not present

## 2016-07-27 ENCOUNTER — Emergency Department (HOSPITAL_COMMUNITY): Payer: Medicare Other

## 2016-07-27 ENCOUNTER — Emergency Department (HOSPITAL_COMMUNITY)
Admission: EM | Admit: 2016-07-27 | Discharge: 2016-07-27 | Disposition: A | Discharge status: Home / Self Care | Payer: Medicare Other | Attending: Emergency Medicine | Admitting: Emergency Medicine

## 2016-07-27 ENCOUNTER — Encounter (HOSPITAL_COMMUNITY): Payer: Self-pay

## 2016-07-27 DIAGNOSIS — Z87891 Personal history of nicotine dependence: Secondary | ICD-10-CM | POA: Insufficient documentation

## 2016-07-27 DIAGNOSIS — R05 Cough: Secondary | ICD-10-CM | POA: Diagnosis not present

## 2016-07-27 DIAGNOSIS — E119 Type 2 diabetes mellitus without complications: Secondary | ICD-10-CM | POA: Diagnosis not present

## 2016-07-27 DIAGNOSIS — Z7982 Long term (current) use of aspirin: Secondary | ICD-10-CM | POA: Insufficient documentation

## 2016-07-27 DIAGNOSIS — I10 Essential (primary) hypertension: Secondary | ICD-10-CM | POA: Diagnosis not present

## 2016-07-27 DIAGNOSIS — R059 Cough, unspecified: Secondary | ICD-10-CM

## 2016-07-27 DIAGNOSIS — Z79899 Other long term (current) drug therapy: Secondary | ICD-10-CM | POA: Diagnosis not present

## 2016-07-27 DIAGNOSIS — R0602 Shortness of breath: Secondary | ICD-10-CM | POA: Diagnosis not present

## 2016-07-27 DIAGNOSIS — Z853 Personal history of malignant neoplasm of breast: Secondary | ICD-10-CM | POA: Diagnosis not present

## 2016-07-27 MED ORDER — GUAIFENESIN ER 600 MG PO TB12: 600.0000 mg | ORAL_TABLET | Freq: Two times a day (BID) | ORAL | 0 refills | Status: AC | PRN

## 2016-07-27 MED ORDER — BENZONATATE 100 MG PO CAPS: 100.0000 mg | ORAL_CAPSULE | Freq: Three times a day (TID) | ORAL | 0 refills | Status: AC

## 2016-07-27 NOTE — ED Provider Notes (Signed)
[facility] DEPT Provider Note   CSN: [id number] Arrival date & time: 07/27/16  [id number]  First Provider Contact:  First MD Initiated Contact with Patient 07/27/16 1148     History   Chief Complaint Cough  HPI  Victoria [name] is an 82 y.o. female with history of breast cancer, DM, HTN who presents to the ED for evaluation of cough. She states she has had a cough productive of clear sputum for the past ~10 days. She states the cough is worse at night. She states she coughs up a "glass worth" of sputum every night. Denies chest pain but she states she does have an area under her left breast that hurts when she presses it. Denies shortness of breath. Denies fever or chills. Denies abdominal pain, n/v/d. She has not tried anything to alleviate her symptoms. Endorses history of cigarette smoking, quit >10 years ago. She states she has never been diagnosed with COPD but it is listed in her problem list.   Past Medical History:  Diagnosis Date  . Arthritis   . Cancer ([place]) 1981   breast , right  . Diabetes mellitus without complication ([place])   . GERD (gastroesophageal reflux disease)   . Headache(784.0)   . Hypertension     Patient Active Problem List   Diagnosis Date Noted  . C O P D 05/09/2010  . COUGH 05/09/2010  . BREAST CANCER 05/08/2010  . DIABETES, TYPE 2 05/08/2010  . ANXIETY 05/08/2010  . BENIGN POSITIONAL VERTIGO 05/08/2010  . HYPERTENSION 05/08/2010  . G E R D 05/08/2010  . PUD 05/08/2010  . DUODENITIS 05/08/2010  . CHRONIC KIDNEY DISEASE STAGE I 05/08/2010  . OSTEOARTHRITIS, SHOULDER 05/08/2010  . DEGENERATIVE JOINT DISEASE, BACK 05/08/2010  . POLYMYALGIA RHEUMATICA 05/08/2010  . OSTEOPENIA 05/08/2010  . DIVERTICULITIS, HX OF 05/08/2010    Past Surgical History:  Procedure Laterality Date  . ABDOMINAL HYSTERECTOMY     complete, took ovaries  . COLONOSCOPY WITH PROPOFOL N/A 08/22/2014   Procedure: COLONOSCOPY WITH PROPOFOL;  Surgeon: Garlan Fair, MD;   Location: [facility];  Service: Endoscopy;  Laterality: N/A;  . MASTECTOMY Right 1981   chemo done, no radiation  . SMALL INTESTINE SURGERY  1950's   1 foot removed    OB History    No data available       Home Medications    Prior to Admission medications   Medication Sig Start Date End Date Taking? Authorizing Provider  aspirin EC 81 MG tablet Take 81 mg by mouth daily.    Historical Provider, MD  Calcium Carbonate-Vitamin D (CALTRATE 600+D PO) Take 1 tablet by mouth daily.    Historical Provider, MD  Cholecalciferol (VITAMIN D-3) 5000 UNITS TABS Take 1 tablet by mouth daily.    Historical Provider, MD  losartan-hydrochlorothiazide (HYZAAR) 100-25 MG per tablet Take 1 tablet by mouth every morning.    Historical Provider, MD  metFORMIN (GLUCOPHAGE) 500 MG tablet Take 500 mg by mouth 2 (two) times daily with a meal.    Historical Provider, MD  mometasone (NASONEX) 50 MCG/ACT nasal spray Place 2 sprays into the nose at bedtime.    Historical Provider, MD  polyethylene glycol (MIRALAX / GLYCOLAX) packet Take 17 g by mouth daily.    Historical Provider, MD    Family History No family history on file.  Social History Social History  Substance Use Topics  . Smoking status: Former Smoker    Packs/day: 1.50    Years: 40.00  Types: Cigarettes    Quit date: 12/30/2008  . Smokeless tobacco: Never Used  . Alcohol use Yes     Comment: very rare     Allergies   Lisinopril and Penicillins   Review of Systems Review of Systems 10 Systems reviewed and are negative for acute change except as noted in the HPI.   Physical Exam Updated Vital Signs BP 157/87   Pulse 84   Temp 98.1 F (36.7 C) (Oral)   Resp 16   SpO2 97%   Physical Exam  Constitutional: She is oriented to person, place, and time.  Elderly but well appearing  HENT:  Right Ear: External ear normal.  Left Ear: External ear normal.  Nose: Nose normal.  Mouth/Throat: Oropharynx is clear and moist. No  oropharyngeal exudate.  Eyes: Conjunctivae and EOM are normal.  Neck: Neck supple.  Cardiovascular: Normal rate, regular rhythm, normal heart sounds and intact distal pulses.   Pulmonary/Chest: Effort normal and breath sounds normal. No respiratory distress. She has no wheezes. She has no rales. She exhibits tenderness.    Abdominal: Soft. Bowel sounds are normal. She exhibits no distension. There is no tenderness. There is no rebound and no guarding.  Musculoskeletal: She exhibits no edema.  Neurological: She is alert and oriented to person, place, and time. No cranial nerve deficit.  Skin: Skin is warm and dry.  Psychiatric: She has a normal mood and affect.  Nursing note and vitals reviewed.    ED Treatments / Results  Labs (all labs ordered are listed, but only abnormal results are displayed) Labs Reviewed - No data to display  EKG  EKG Interpretation  Date/Time:  Saturday July 27 2016 12:00:01 EDT Ventricular Rate:  82 PR Interval:    QRS Duration: 81 QT Interval:  369 QTC Calculation: 431 R Axis:   61 Text Interpretation:  Sinus rhythm Abnormal R-wave progression, early transition no acute ST/T changes No old tracing to compare Confirmed by GOLDSTON MD, SCOTT [phone]) on 07/27/2016 12:28:43 PM       Radiology Dg Chest 2 View  Result Date: 07/27/2016 CLINICAL DATA:  Cough with shortness of breath for 1 day. EXAM: CHEST  2 VIEW COMPARISON:  08/13/2011 and prior radiographs dating back to 04/15/2005 FINDINGS: The cardiopericardial silhouette is unremarkable. Fullness of the right hilum is unchanged from 2006. COPD/emphysema changes noted. There is no evidence of focal airspace disease, pulmonary edema, suspicious pulmonary nodule/mass, pleural effusion, or pneumothorax. No acute bony abnormalities are identified. IMPRESSION: COPD/emphysema without evidence of acute cardiopulmonary disease. Electronically Signed   By: Margarette Canada M.D.   On: 07/27/2016  10:41   Procedures Procedures (including critical care time)  Medications Ordered in ED Medications - No data to display   Initial Impression / Assessment and Plan / ED Course  I have reviewed the triage vital signs and the nursing notes.  Pertinent labs & imaging results that were available during my care of the patient were reviewed by me and considered in my medical decision making (see chart for details).  Clinical Course    Pt presenting with cough for the past 10 days, productive of clear sputum. She is afebrile with stable and unremarkable vital signs. Clinically she appears well. Her lungs are CTAB. She has no increased WOB or tachypnea. Her CXR reveals COPD/emphysema; she states she has never been diagnosed with COPD before but it is in her active problem list. Likely mild COPD exacerbation, possibly due to viral URI. EKG nonacute. She has  some chest wall tenderness likely from coughing so much. Will d/c home with rx for supportive cough medicine. PCP is Dr. Lysle Rubens. Encouraged close f/u, possible PFTs as an outpatient. ER return precautions given.  Final Clinical Impressions(s) / ED Diagnoses   Final diagnoses:  Cough    New Prescriptions New Prescriptions   BENZONATATE (TESSALON) 100 MG CAPSULE    Take 1 capsule (100 mg total) by mouth every 8 (eight) hours.   GUAIFENESIN (MUCINEX) 600 MG 12 HR TABLET    Take 1 tablet (600 mg total) by mouth 2 (two) times daily as needed for to loosen phlegm.     Anne Ng, PA-C 07/27/16 [place], MD 07/27/16 [phone]

## 2016-07-27 NOTE — Discharge Instructions (Signed)
Your chest x-ray today showed evidence of COPD/emphysema. I will give you a couple prescriptions to help with your cough. You do not need antibiotics at this time. Please call Dr. Glenna Durand office to schedule a follow upa ppointment as soon as possible. Return to the ER for new or worsening symptoms.

## 2016-07-27 NOTE — ED Triage Notes (Signed)
Patient here with cough x 10-15 days. Describes the cough as a dry cough. No acute distress. Has taken otc medicine with minimal relief

## 2016-08-07 DIAGNOSIS — H524 Presbyopia: Secondary | ICD-10-CM | POA: Diagnosis not present

## 2016-08-09 DIAGNOSIS — N182 Chronic kidney disease, stage 2 (mild): Secondary | ICD-10-CM | POA: Diagnosis not present

## 2016-08-09 DIAGNOSIS — J449 Chronic obstructive pulmonary disease, unspecified: Secondary | ICD-10-CM | POA: Diagnosis not present

## 2016-08-09 DIAGNOSIS — I129 Hypertensive chronic kidney disease with stage 1 through stage 4 chronic kidney disease, or unspecified chronic kidney disease: Secondary | ICD-10-CM | POA: Diagnosis not present

## 2016-08-09 DIAGNOSIS — R05 Cough: Secondary | ICD-10-CM | POA: Diagnosis not present

## 2016-08-09 DIAGNOSIS — F0390 Unspecified dementia without behavioral disturbance: Secondary | ICD-10-CM | POA: Diagnosis not present

## 2016-08-09 DIAGNOSIS — E1122 Type 2 diabetes mellitus with diabetic chronic kidney disease: Secondary | ICD-10-CM | POA: Diagnosis not present

## 2016-09-11 DIAGNOSIS — Z23 Encounter for immunization: Secondary | ICD-10-CM | POA: Diagnosis not present

## 2016-09-11 DIAGNOSIS — I10 Essential (primary) hypertension: Secondary | ICD-10-CM | POA: Diagnosis not present

## 2016-09-11 DIAGNOSIS — E1122 Type 2 diabetes mellitus with diabetic chronic kidney disease: Secondary | ICD-10-CM | POA: Diagnosis not present

## 2016-09-11 DIAGNOSIS — F0390 Unspecified dementia without behavioral disturbance: Secondary | ICD-10-CM | POA: Diagnosis not present

## 2016-10-09 DIAGNOSIS — E1122 Type 2 diabetes mellitus with diabetic chronic kidney disease: Secondary | ICD-10-CM | POA: Diagnosis not present

## 2016-10-09 DIAGNOSIS — R269 Unspecified abnormalities of gait and mobility: Secondary | ICD-10-CM | POA: Diagnosis not present

## 2016-10-09 DIAGNOSIS — N182 Chronic kidney disease, stage 2 (mild): Secondary | ICD-10-CM | POA: Diagnosis not present

## 2016-10-09 DIAGNOSIS — I739 Peripheral vascular disease, unspecified: Secondary | ICD-10-CM | POA: Diagnosis not present

## 2016-10-09 DIAGNOSIS — F0390 Unspecified dementia without behavioral disturbance: Secondary | ICD-10-CM | POA: Diagnosis not present

## 2016-10-09 DIAGNOSIS — I10 Essential (primary) hypertension: Secondary | ICD-10-CM | POA: Diagnosis not present

## 2016-10-09 DIAGNOSIS — C50911 Malignant neoplasm of unspecified site of right female breast: Secondary | ICD-10-CM | POA: Diagnosis not present

## 2016-10-15 NOTE — Patient Outreach (Deleted)
Referral received from [facility] (Karrar Husain,MD). Patient is not eligible for [facility] CM Services due to insurance type/not on APL. Faxed [facility] back stating patient not eligible

## 2016-10-16 NOTE — Progress Notes (Signed)
This encounter was created in error - please disregard.

## 2016-10-16 NOTE — Addendum Note (Signed)
Addended by: Virgel Manifold on: 10/16/2016 11:21 AM   Modules accepted: Miquel Dunn

## 2016-10-24 DIAGNOSIS — M810 Age-related osteoporosis without current pathological fracture: Secondary | ICD-10-CM | POA: Diagnosis not present

## 2016-10-24 DIAGNOSIS — I10 Essential (primary) hypertension: Secondary | ICD-10-CM | POA: Diagnosis not present

## 2016-10-24 DIAGNOSIS — F0390 Unspecified dementia without behavioral disturbance: Secondary | ICD-10-CM | POA: Diagnosis not present

## 2016-10-24 DIAGNOSIS — E1122 Type 2 diabetes mellitus with diabetic chronic kidney disease: Secondary | ICD-10-CM | POA: Diagnosis not present

## 2016-10-24 DIAGNOSIS — J449 Chronic obstructive pulmonary disease, unspecified: Secondary | ICD-10-CM | POA: Diagnosis not present

## 2016-10-24 DIAGNOSIS — C50911 Malignant neoplasm of unspecified site of right female breast: Secondary | ICD-10-CM | POA: Diagnosis not present

## 2016-10-24 DIAGNOSIS — N182 Chronic kidney disease, stage 2 (mild): Secondary | ICD-10-CM | POA: Diagnosis not present

## 2016-12-05 ENCOUNTER — Other Ambulatory Visit: Payer: Self-pay | Admitting: Internal Medicine

## 2016-12-05 DIAGNOSIS — F0390 Unspecified dementia without behavioral disturbance: Secondary | ICD-10-CM | POA: Diagnosis not present

## 2016-12-05 DIAGNOSIS — C50911 Malignant neoplasm of unspecified site of right female breast: Secondary | ICD-10-CM | POA: Diagnosis not present

## 2016-12-05 DIAGNOSIS — E1122 Type 2 diabetes mellitus with diabetic chronic kidney disease: Secondary | ICD-10-CM | POA: Diagnosis not present

## 2016-12-05 DIAGNOSIS — Z1231 Encounter for screening mammogram for malignant neoplasm of breast: Secondary | ICD-10-CM

## 2016-12-05 DIAGNOSIS — R269 Unspecified abnormalities of gait and mobility: Secondary | ICD-10-CM | POA: Diagnosis not present

## 2016-12-05 DIAGNOSIS — H8110 Benign paroxysmal vertigo, unspecified ear: Secondary | ICD-10-CM | POA: Diagnosis not present

## 2016-12-05 DIAGNOSIS — W19XXXA Unspecified fall, initial encounter: Secondary | ICD-10-CM | POA: Diagnosis not present

## 2016-12-06 ENCOUNTER — Ambulatory Visit
Admission: RE | Admit: 2016-12-06 | Discharge: 2016-12-06 | Disposition: A | Discharge status: Home / Self Care | Payer: Medicare Other | Source: Ambulatory Visit | Attending: Internal Medicine | Admitting: Internal Medicine

## 2016-12-06 DIAGNOSIS — Z1231 Encounter for screening mammogram for malignant neoplasm of breast: Secondary | ICD-10-CM

## 2016-12-06 DIAGNOSIS — E1122 Type 2 diabetes mellitus with diabetic chronic kidney disease: Secondary | ICD-10-CM | POA: Diagnosis not present

## 2016-12-06 DIAGNOSIS — N182 Chronic kidney disease, stage 2 (mild): Secondary | ICD-10-CM | POA: Diagnosis not present

## 2016-12-06 DIAGNOSIS — I129 Hypertensive chronic kidney disease with stage 1 through stage 4 chronic kidney disease, or unspecified chronic kidney disease: Secondary | ICD-10-CM | POA: Diagnosis not present

## 2016-12-06 DIAGNOSIS — M353 Polymyalgia rheumatica: Secondary | ICD-10-CM | POA: Diagnosis not present

## 2016-12-06 DIAGNOSIS — F0390 Unspecified dementia without behavioral disturbance: Secondary | ICD-10-CM | POA: Diagnosis not present

## 2016-12-10 ENCOUNTER — Other Ambulatory Visit: Payer: Self-pay | Admitting: Internal Medicine

## 2016-12-10 DIAGNOSIS — R928 Other abnormal and inconclusive findings on diagnostic imaging of breast: Secondary | ICD-10-CM

## 2016-12-13 ENCOUNTER — Ambulatory Visit
Admission: RE | Admit: 2016-12-13 | Discharge: 2016-12-13 | Disposition: A | Discharge status: Home / Self Care | Payer: Medicare Other | Source: Ambulatory Visit | Attending: Internal Medicine | Admitting: Internal Medicine

## 2016-12-13 DIAGNOSIS — R922 Inconclusive mammogram: Secondary | ICD-10-CM | POA: Diagnosis not present

## 2016-12-13 DIAGNOSIS — R928 Other abnormal and inconclusive findings on diagnostic imaging of breast: Secondary | ICD-10-CM

## 2017-01-24 DIAGNOSIS — C50911 Malignant neoplasm of unspecified site of right female breast: Secondary | ICD-10-CM | POA: Diagnosis not present

## 2017-01-24 DIAGNOSIS — I10 Essential (primary) hypertension: Secondary | ICD-10-CM | POA: Diagnosis not present

## 2017-01-24 DIAGNOSIS — F0390 Unspecified dementia without behavioral disturbance: Secondary | ICD-10-CM | POA: Diagnosis not present

## 2017-01-24 DIAGNOSIS — M810 Age-related osteoporosis without current pathological fracture: Secondary | ICD-10-CM | POA: Diagnosis not present

## 2017-01-24 DIAGNOSIS — J449 Chronic obstructive pulmonary disease, unspecified: Secondary | ICD-10-CM | POA: Diagnosis not present

## 2017-01-24 DIAGNOSIS — N182 Chronic kidney disease, stage 2 (mild): Secondary | ICD-10-CM | POA: Diagnosis not present

## 2017-01-24 DIAGNOSIS — E1122 Type 2 diabetes mellitus with diabetic chronic kidney disease: Secondary | ICD-10-CM | POA: Diagnosis not present

## 2017-02-10 DIAGNOSIS — E1122 Type 2 diabetes mellitus with diabetic chronic kidney disease: Secondary | ICD-10-CM | POA: Diagnosis not present

## 2017-02-10 DIAGNOSIS — I10 Essential (primary) hypertension: Secondary | ICD-10-CM | POA: Diagnosis not present

## 2017-02-10 DIAGNOSIS — C50911 Malignant neoplasm of unspecified site of right female breast: Secondary | ICD-10-CM | POA: Diagnosis not present

## 2017-02-10 DIAGNOSIS — I739 Peripheral vascular disease, unspecified: Secondary | ICD-10-CM | POA: Diagnosis not present

## 2017-04-10 ENCOUNTER — Other Ambulatory Visit: Payer: Self-pay | Admitting: Internal Medicine

## 2017-04-10 DIAGNOSIS — R1084 Generalized abdominal pain: Secondary | ICD-10-CM

## 2017-04-10 DIAGNOSIS — R109 Unspecified abdominal pain: Secondary | ICD-10-CM | POA: Diagnosis not present

## 2017-04-10 DIAGNOSIS — R5383 Other fatigue: Secondary | ICD-10-CM | POA: Diagnosis not present

## 2017-04-10 DIAGNOSIS — R630 Anorexia: Secondary | ICD-10-CM | POA: Diagnosis not present

## 2017-04-14 ENCOUNTER — Ambulatory Visit
Admission: RE | Admit: 2017-04-14 | Discharge: 2017-04-14 | Disposition: A | Discharge status: Home / Self Care | Payer: Medicare Other | Source: Ambulatory Visit | Attending: Internal Medicine | Admitting: Internal Medicine

## 2017-04-14 ENCOUNTER — Other Ambulatory Visit: Payer: Self-pay | Admitting: Internal Medicine

## 2017-04-14 DIAGNOSIS — R1084 Generalized abdominal pain: Secondary | ICD-10-CM

## 2017-05-09 ENCOUNTER — Institutional Professional Consult (permissible substitution): Payer: Medicare Other | Admitting: Internal Medicine

## 2017-05-13 ENCOUNTER — Ambulatory Visit
Admission: RE | Admit: 2017-05-13 | Discharge: 2017-05-13 | Disposition: A | Discharge status: Home / Self Care | Payer: Medicare Other | Source: Ambulatory Visit | Attending: Internal Medicine | Admitting: Internal Medicine

## 2017-05-13 ENCOUNTER — Other Ambulatory Visit: Payer: Self-pay | Admitting: Internal Medicine

## 2017-05-13 DIAGNOSIS — K5900 Constipation, unspecified: Secondary | ICD-10-CM | POA: Diagnosis not present

## 2017-05-13 DIAGNOSIS — R109 Unspecified abdominal pain: Secondary | ICD-10-CM

## 2017-06-09 ENCOUNTER — Ambulatory Visit (INDEPENDENT_AMBULATORY_CARE_PROVIDER_SITE_OTHER)
Admission: RE | Admit: 2017-06-09 | Discharge: 2017-06-09 | Disposition: A | Discharge status: Home / Self Care | Payer: Medicare Other | Source: Ambulatory Visit | Attending: Pulmonary Disease | Admitting: Pulmonary Disease

## 2017-06-09 ENCOUNTER — Encounter: Payer: Self-pay | Admitting: Pulmonary Disease

## 2017-06-09 ENCOUNTER — Ambulatory Visit (INDEPENDENT_AMBULATORY_CARE_PROVIDER_SITE_OTHER): Payer: Medicare Other | Admitting: Pulmonary Disease

## 2017-06-09 VITALS — BP 114/84 | HR 76 | Ht 65.0 in | Wt 149.8 lb

## 2017-06-09 DIAGNOSIS — R0602 Shortness of breath: Secondary | ICD-10-CM

## 2017-06-09 NOTE — Progress Notes (Signed)
Victoria [name]    [id number]    12-25-1927  Primary Care Physician:Husain, Denton Ar, MD  Referring Physician: Wenda Low, MD [facility] [employer] [place] [address], [place] [address]  Chief complaint:  Consult for evaluation of COPD  HPI: 83 year old with history of dementia, breast cancer, diabetes, anxiety, GERD, CAD. She will chest x-ray last year which shows hyperinflation suggestive of COPD. She is here for further evaluation  She denies any dyspnea, wheezing. Chest some cough with sputum production at nighttime. No fevers, chills, hemoptysis. She is not currently on any inhalers.  Outpatient Encounter Prescriptions as of 06/09/2017  Medication Sig  . aspirin EC 81 MG tablet Take 81 mg by mouth daily.  . benzonatate (TESSALON) 100 MG capsule Take 1 capsule (100 mg total) by mouth every 8 (eight) hours.  . Cholecalciferol (VITAMIN D-3) 5000 UNITS TABS Take 1 tablet by mouth daily.  Marland Kitchen guaiFENesin (MUCINEX) 600 MG 12 hr tablet Take 1 tablet (600 mg total) by mouth 2 (two) times daily as needed for to loosen phlegm.  Marland Kitchen losartan-hydrochlorothiazide (HYZAAR) 100-25 MG per tablet Take 1 tablet by mouth every morning.  . mometasone (NASONEX) 50 MCG/ACT nasal spray Place 2 sprays into the nose at bedtime.  . polyethylene glycol (MIRALAX / GLYCOLAX) packet Take 17 g by mouth daily.  . [DISCONTINUED] metFORMIN (GLUCOPHAGE) 500 MG tablet Take 500 mg by mouth 2 (two) times daily with a meal.  . [DISCONTINUED] Calcium Carbonate-Vitamin D (CALTRATE 600+D PO) Take 1 tablet by mouth daily.   No facility-administered encounter medications on file as of 06/09/2017.     Allergies as of 06/09/2017 - Review Complete 06/09/2017  Allergen Reaction Noted  . Lisinopril    . Penicillins      Past Medical History:  Diagnosis Date  . Arthritis   . Cancer ([place]) 1981   breast , right  . Diabetes mellitus without complication ([place])   . GERD (gastroesophageal reflux disease)   .  Headache(784.0)   . Hypertension     Past Surgical History:  Procedure Laterality Date  . ABDOMINAL HYSTERECTOMY     complete, took ovaries  . COLONOSCOPY WITH PROPOFOL N/A 08/22/2014   Procedure: COLONOSCOPY WITH PROPOFOL;  Surgeon: Garlan Fair, MD;  Location: [facility];  Service: Endoscopy;  Laterality: N/A;  . MASTECTOMY Right 1981   chemo done, no radiation  . SMALL INTESTINE SURGERY  1950's   1 foot removed    Family History  Problem Relation Age of Onset  . Family history unknown: Yes    Social History   Social History  . Marital status: Divorced    Spouse name: N/A  . Number of children: N/A  . Years of education: N/A   Occupational History  . Not on file.   Social History Main Topics  . Smoking status: Former Smoker    Packs/day: 1.50    Years: 40.00    Types: Cigarettes    Quit date: 12/30/2008  . Smokeless tobacco: Never Used  . Alcohol use Yes     Comment: very rare  . Drug use: No  . Sexual activity: Not on file   Other Topics Concern  . Not on file   Social History Narrative  . No narrative on file    Review of systems: Review of Systems  Constitutional: Negative for fever and chills.  HENT: Negative.   Eyes: Negative for blurred vision.  Respiratory: as per HPI  Cardiovascular: Negative  for chest pain and palpitations.  Gastrointestinal: Negative for vomiting, diarrhea, blood per rectum. Genitourinary: Negative for dysuria, urgency, frequency and hematuria.  Musculoskeletal: Negative for myalgias, back pain and joint pain.  Skin: Negative for itching and rash.  Neurological: Negative for dizziness, tremors, focal weakness, seizures and loss of consciousness.  Endo/Heme/Allergies: Negative for environmental allergies.  Psychiatric/Behavioral: Negative for depression, suicidal ideas and hallucinations.  All other systems reviewed and are negative.  Physical Exam: Blood pressure 114/84, pulse 76, height 5\' 5"  (1.651 m), weight 149 lb  12.8 oz (67.9 kg), SpO2 99 %. Gen:      No acute distress HEENT:  EOMI, sclera anicteric Neck:     No masses; no thyromegaly Lungs:    Clear to auscultation bilaterally; normal respiratory effort CV:         Regular rate and rhythm; no murmurs Abd:      + bowel sounds; soft, non-tender; no palpable masses, no distension Ext:    No edema; adequate peripheral perfusion Skin:      Warm and dry; no rash Neuro: alert and oriented x 3 Psych: normal mood and affect  Data Reviewed: Chest x-ray 07/27/16-hyperinflation, COPD. No acute changes I have reviewed all images personally.  Assessment:  Evaluation for COPD I reviewed the chest x-ray which shows hyperinflation consistent with COPD. We will schedule pulmonary function tests and a repeat chest x-ray for further evaluation She is currently not on any inhalers and I don't believe she needs to be on any regular medications. We'll reevaluate after getting the PFTs.  Plan/Recommendations: - PFTs, chest x-ray - Observe off inhalers   Marshell Garfinkel MD [facility] Pager [phone] 06/09/2017, 4:38 PM  CC: Wenda Low, MD

## 2017-06-09 NOTE — Patient Instructions (Signed)
We'll schedule for chest x-ray and pulmonary function tests Return to clinic in 3 months.

## 2017-06-25 DIAGNOSIS — C50911 Malignant neoplasm of unspecified site of right female breast: Secondary | ICD-10-CM | POA: Diagnosis not present

## 2017-06-25 DIAGNOSIS — I10 Essential (primary) hypertension: Secondary | ICD-10-CM | POA: Diagnosis not present

## 2017-06-25 DIAGNOSIS — E1122 Type 2 diabetes mellitus with diabetic chronic kidney disease: Secondary | ICD-10-CM | POA: Diagnosis not present

## 2017-06-25 DIAGNOSIS — I739 Peripheral vascular disease, unspecified: Secondary | ICD-10-CM | POA: Diagnosis not present

## 2017-08-07 DIAGNOSIS — H5201 Hypermetropia, right eye: Secondary | ICD-10-CM | POA: Diagnosis not present

## 2017-08-21 DIAGNOSIS — E1122 Type 2 diabetes mellitus with diabetic chronic kidney disease: Secondary | ICD-10-CM | POA: Diagnosis not present

## 2017-08-21 DIAGNOSIS — I739 Peripheral vascular disease, unspecified: Secondary | ICD-10-CM | POA: Diagnosis not present

## 2017-08-21 DIAGNOSIS — F0390 Unspecified dementia without behavioral disturbance: Secondary | ICD-10-CM | POA: Diagnosis not present

## 2017-08-21 DIAGNOSIS — I10 Essential (primary) hypertension: Secondary | ICD-10-CM | POA: Diagnosis not present

## 2017-08-22 DIAGNOSIS — M25562 Pain in left knee: Secondary | ICD-10-CM | POA: Diagnosis not present

## 2017-08-22 DIAGNOSIS — M1712 Unilateral primary osteoarthritis, left knee: Secondary | ICD-10-CM | POA: Diagnosis not present

## 2017-09-02 ENCOUNTER — Ambulatory Visit (INDEPENDENT_AMBULATORY_CARE_PROVIDER_SITE_OTHER): Payer: BC Managed Care – PPO | Admitting: Podiatry

## 2017-09-02 ENCOUNTER — Encounter: Payer: Self-pay | Admitting: Podiatry

## 2017-09-02 VITALS — BP 159/84 | HR 75

## 2017-09-02 DIAGNOSIS — M79674 Pain in right toe(s): Secondary | ICD-10-CM

## 2017-09-02 DIAGNOSIS — E119 Type 2 diabetes mellitus without complications: Secondary | ICD-10-CM

## 2017-09-02 DIAGNOSIS — B351 Tinea unguium: Secondary | ICD-10-CM

## 2017-09-02 DIAGNOSIS — M79675 Pain in left toe(s): Secondary | ICD-10-CM

## 2017-09-02 NOTE — Progress Notes (Signed)
   Subjective:    Patient ID: Victoria [name], female    DOB: Aug 21, 1927, 83 y.o.   MRN: [id number]  HPI this patient presents the office with chief complaint of long thick painful toenails. This patient states the nails are painful walking and wearing her shoes. This patient has been diagnosed as a type II diabetic.  This patient presents the office for preventative foot care services    Review of Systems  Constitutional: Positive for activity change, appetite change, chills, fatigue and fever.  HENT: Positive for ear pain, hearing loss, rhinorrhea, sinus pain, sneezing and sore throat.   Respiratory: Positive for cough and wheezing.   Cardiovascular: Positive for chest pain, palpitations and leg swelling.  Gastrointestinal: Positive for constipation.  Musculoskeletal: Positive for gait problem.       Joint pain  Skin:       change in nails  Neurological: Positive for headaches.       Objective:   Physical Exam GENERAL APPEARANCE: Alert, conversant. Appropriately groomed. No acute distress.  VASCULAR: Pedal pulses are  palpable at  [facility] and PT bilateral.  Capillary refill time is immediate to all digits,  Normal temperature gradient.  Digital hair growth is present bilateral  NEUROLOGIC: sensation is normal to 5.07 monofilament at 5/5 sites bilateral.  Light touch is intact bilateral, Muscle strength normal.  MUSCULOSKELETAL: acceptable muscle strength, tone and stability bilateral.  Intrinsic muscluature intact bilateral.  Rectus appearance of foot and digits noted bilateral.  NAILS  thick disfigured discolored nails with subungual debris noted 10.  No evidence of bacterial infection or drainage DERMATOLOGIC: skin color, texture, and turgor are within normal limits.  No preulcerative lesions or ulcers  are seen, no interdigital maceration noted.  No open lesions present. . No drainage noted.         Assessment & Plan:  Onychomycosis  Diabetes with no complications.   ie  Debride  nails.  RTC 3 months.   Gardiner Barefoot DPM

## 2017-09-16 ENCOUNTER — Ambulatory Visit (INDEPENDENT_AMBULATORY_CARE_PROVIDER_SITE_OTHER): Payer: Medicare Other | Admitting: Pulmonary Disease

## 2017-09-16 ENCOUNTER — Encounter: Payer: Self-pay | Admitting: Pulmonary Disease

## 2017-09-16 VITALS — BP 128/78 | HR 87 | Ht 64.5 in | Wt 142.0 lb

## 2017-09-16 DIAGNOSIS — R0602 Shortness of breath: Secondary | ICD-10-CM

## 2017-09-16 LAB — PULMONARY FUNCTION TEST
DL/VA % PRED: 84 %
DL/VA: 3.96 ml/min/mmHg/L
DLCO UNC % PRED: 60 %
DLCO cor % pred: 64 %
DLCO cor: 14.87 ml/min/mmHg
DLCO unc: 13.87 ml/min/mmHg
FEF 25-75 PRE: 2.54 L/s
FEF 25-75 Post: 1.45 L/sec
FEF2575-%CHANGE-POST: -42 %
FEF2575-%PRED-POST: 154 %
FEF2575-%Pred-Pre: 269 %
FEV1-%CHANGE-POST: -12 %
FEV1-%Pred-Post: 125 %
FEV1-%Pred-Pre: 142 %
FEV1-PRE: 1.76 L
FEV1-Post: 1.55 L
FEV1FVC-%Change-Post: 0 %
FEV1FVC-%PRED-PRE: 118 %
FEV6-%Change-Post: -12 %
FEV6-%Pred-Post: 115 %
FEV6-%Pred-Pre: 132 %
FEV6-Post: 1.75 L
FEV6-Pre: 2.01 L
FEV6FVC-%Pred-Post: 105 %
FEV6FVC-%Pred-Pre: 105 %
FVC-%Change-Post: -12 %
FVC-%PRED-PRE: 125 %
FVC-%Pred-Post: 109 %
FVC-PRE: 2.01 L
FVC-Post: 1.76 L
POST FEV1/FVC RATIO: 88 %
PRE FEV6/FVC RATIO: 100 %
Post FEV6/FVC ratio: 100 %
Pre FEV1/FVC ratio: 88 %
RV % PRED: 61 %
RV: 1.54 L
TLC % pred: 80 %
TLC: 3.96 L

## 2017-09-16 NOTE — Progress Notes (Signed)
Victoria [name]    [id number]    June 28, 1927  Primary Care Physician:Husain, Denton Ar, MD  Referring Physician: Wenda Low, MD [facility] [employer] [place] [address], [place] [address]  Chief complaint:  Follow-up for evaluation of COPD  HPI: 83 year old with history of dementia, breast cancer, diabetes, anxiety, GERD, CAD. She will chest x-ray last year which shows hyperinflation suggestive of COPD. She is here for further evaluation  She denies any dyspnea, wheezing. She has some cough with sputum production at nighttime. No fevers, chills, hemoptysis. She is not currently on any inhalers.  Interim History: She continues to feel well with no symptoms. She's had PFTs done today and is here for review.  Outpatient Encounter Prescriptions as of 09/16/2017  Medication Sig  . aspirin EC 81 MG tablet Take 81 mg by mouth daily.  . benzonatate (TESSALON) 100 MG capsule Take 1 capsule (100 mg total) by mouth every 8 (eight) hours.  . Cholecalciferol (VITAMIN D-3) 5000 UNITS TABS Take 1 tablet by mouth daily.  Marland Kitchen guaiFENesin (MUCINEX) 600 MG 12 hr tablet Take 1 tablet (600 mg total) by mouth 2 (two) times daily as needed for to loosen phlegm.  . mometasone (NASONEX) 50 MCG/ACT nasal spray Place 2 sprays into the nose at bedtime.  Marland Kitchen PANTOPRAZOLE SODIUM PO Take by mouth.  . polyethylene glycol (MIRALAX / GLYCOLAX) packet Take 17 g by mouth daily.  . DONEPEZIL HCL PO Take by mouth.  . losartan-hydrochlorothiazide (HYZAAR) 100-25 MG per tablet Take 1 tablet by mouth every morning.   No facility-administered encounter medications on file as of 09/16/2017.     Allergies as of 09/16/2017 - Review Complete 09/16/2017  Allergen Reaction Noted  . Lisinopril    . Penicillins      Past Medical History:  Diagnosis Date  . Arthritis   . Cancer ([place]) 1981   breast , right  . Diabetes mellitus without complication ([place])   . GERD (gastroesophageal reflux disease)   . Headache(784.0)   .  Hypertension     Past Surgical History:  Procedure Laterality Date  . ABDOMINAL HYSTERECTOMY     complete, took ovaries  . COLONOSCOPY WITH PROPOFOL N/A 08/22/2014   Procedure: COLONOSCOPY WITH PROPOFOL;  Surgeon: Garlan Fair, MD;  [facility];  Service: Endoscopy;  Laterality: N/A;  . MASTECTOMY Right 1981   chemo done, no radiation  . SMALL INTESTINE SURGERY  1950's   1 foot removed    Family History  Problem Relation Age of Onset  . Family history unknown: Yes    Social History   Social History  . Marital status: Divorced    Spouse name: N/A  . Number of children: N/A  . Years of education: N/A   Occupational History  . Not on file.   Social History Main Topics  . Smoking status: Former Smoker    Packs/day: 1.50    Years: 40.00    Types: Cigarettes    Quit date: 12/30/2008  . Smokeless tobacco: Never Used  . Alcohol use Yes     Comment: very rare  . Drug use: No  . Sexual activity: Not on file   Other Topics Concern  . Not on file   Social History Narrative  . No narrative on file    Review of systems: Review of Systems  Constitutional: Negative for fever and chills.  HENT: Negative.   Eyes: Negative for blurred vision.  Respiratory: as per HPI  Cardiovascular: Negative for chest pain and palpitations.  Gastrointestinal: Negative for vomiting, diarrhea, blood per rectum. Genitourinary: Negative for dysuria, urgency, frequency and hematuria.  Musculoskeletal: Negative for myalgias, back pain and joint pain.  Skin: Negative for itching and rash.  Neurological: Negative for dizziness, tremors, focal weakness, seizures and loss of consciousness.  Endo/Heme/Allergies: Negative for environmental allergies.  Psychiatric/Behavioral: Negative for depression, suicidal ideas and hallucinations.  All other systems reviewed and are negative.  Physical Exam: Blood pressure 128/78, pulse 87, height 5' 4.5" (1.638 m), weight 142 lb (64.4 kg), SpO2 98  %. Gen:      No acute distress HEENT:  EOMI, sclera anicteric Neck:     No masses; no thyromegaly Lungs:    Clear to auscultation bilaterally; normal respiratory effort CV:         Regular rate and rhythm; no murmurs Abd:      + bowel sounds; soft, non-tender; no palpable masses, no distension Ext:    No edema; adequate peripheral perfusion Skin:      Warm and dry; no rash Neuro: alert and oriented x 3 Psych: normal mood and affect  Data Reviewed: Chest x-ray 07/27/16-hyperinflation, COPD. No acute changes Chest x-ray 06/09/17- hyperinflation, mild peribronchial thickening. I have reviewed all images personally.  PFTs 09/16/17 FVC 1.76 [109%], FEV1 1.55 [125%],/F 88, TLC 80%, DLCO/VA 84%. Minimal reduction in diffusion capacity which corrects for alveolar volume.  Assessment:  Evaluation for COPD I reviewed the chest x-ray which shows hyperinflation consistent with COPD but PFTs do not show any obstruction. There is mild reduction in diffusion capacity which corrects for alveolar volume. This likely of no clinical significance.  She is currently not on any inhalers and I don't believe she needs to be on any regular medications as she is asymptomatic.  She can follow up with the pulmonary clinic as needed.  Plan/Recommendations: - Follow up as needed  Marshell Garfinkel MD [facility] Pager [phone] 09/16/2017, 1:53 PM  CC: Wenda Low, MD

## 2017-09-16 NOTE — Progress Notes (Signed)
PFT done today. 

## 2017-09-16 NOTE — Patient Instructions (Signed)
I had reviewed the lung function tests. There is no evidence of COPD. I'm glad to feeling better and do not need any inhalers. You can follow up with the clinic as needed. Please call us if there is any change in your symptoms.

## 2017-10-22 DIAGNOSIS — C50911 Malignant neoplasm of unspecified site of right female breast: Secondary | ICD-10-CM | POA: Diagnosis not present

## 2017-10-22 DIAGNOSIS — Z23 Encounter for immunization: Secondary | ICD-10-CM | POA: Diagnosis not present

## 2017-10-22 DIAGNOSIS — E1122 Type 2 diabetes mellitus with diabetic chronic kidney disease: Secondary | ICD-10-CM | POA: Diagnosis not present

## 2017-10-22 DIAGNOSIS — I10 Essential (primary) hypertension: Secondary | ICD-10-CM | POA: Diagnosis not present

## 2017-11-22 IMAGING — CR DG CHEST 2V
2 series · 2 of 2 positions shown · non-contrast
Comparison: 08/13/2011 and prior radiographs dating back to
04/15/2005

CLINICAL DATA: Cough with shortness of breath for 1 day.

EXAM:
CHEST  2 VIEW

[chest pa]
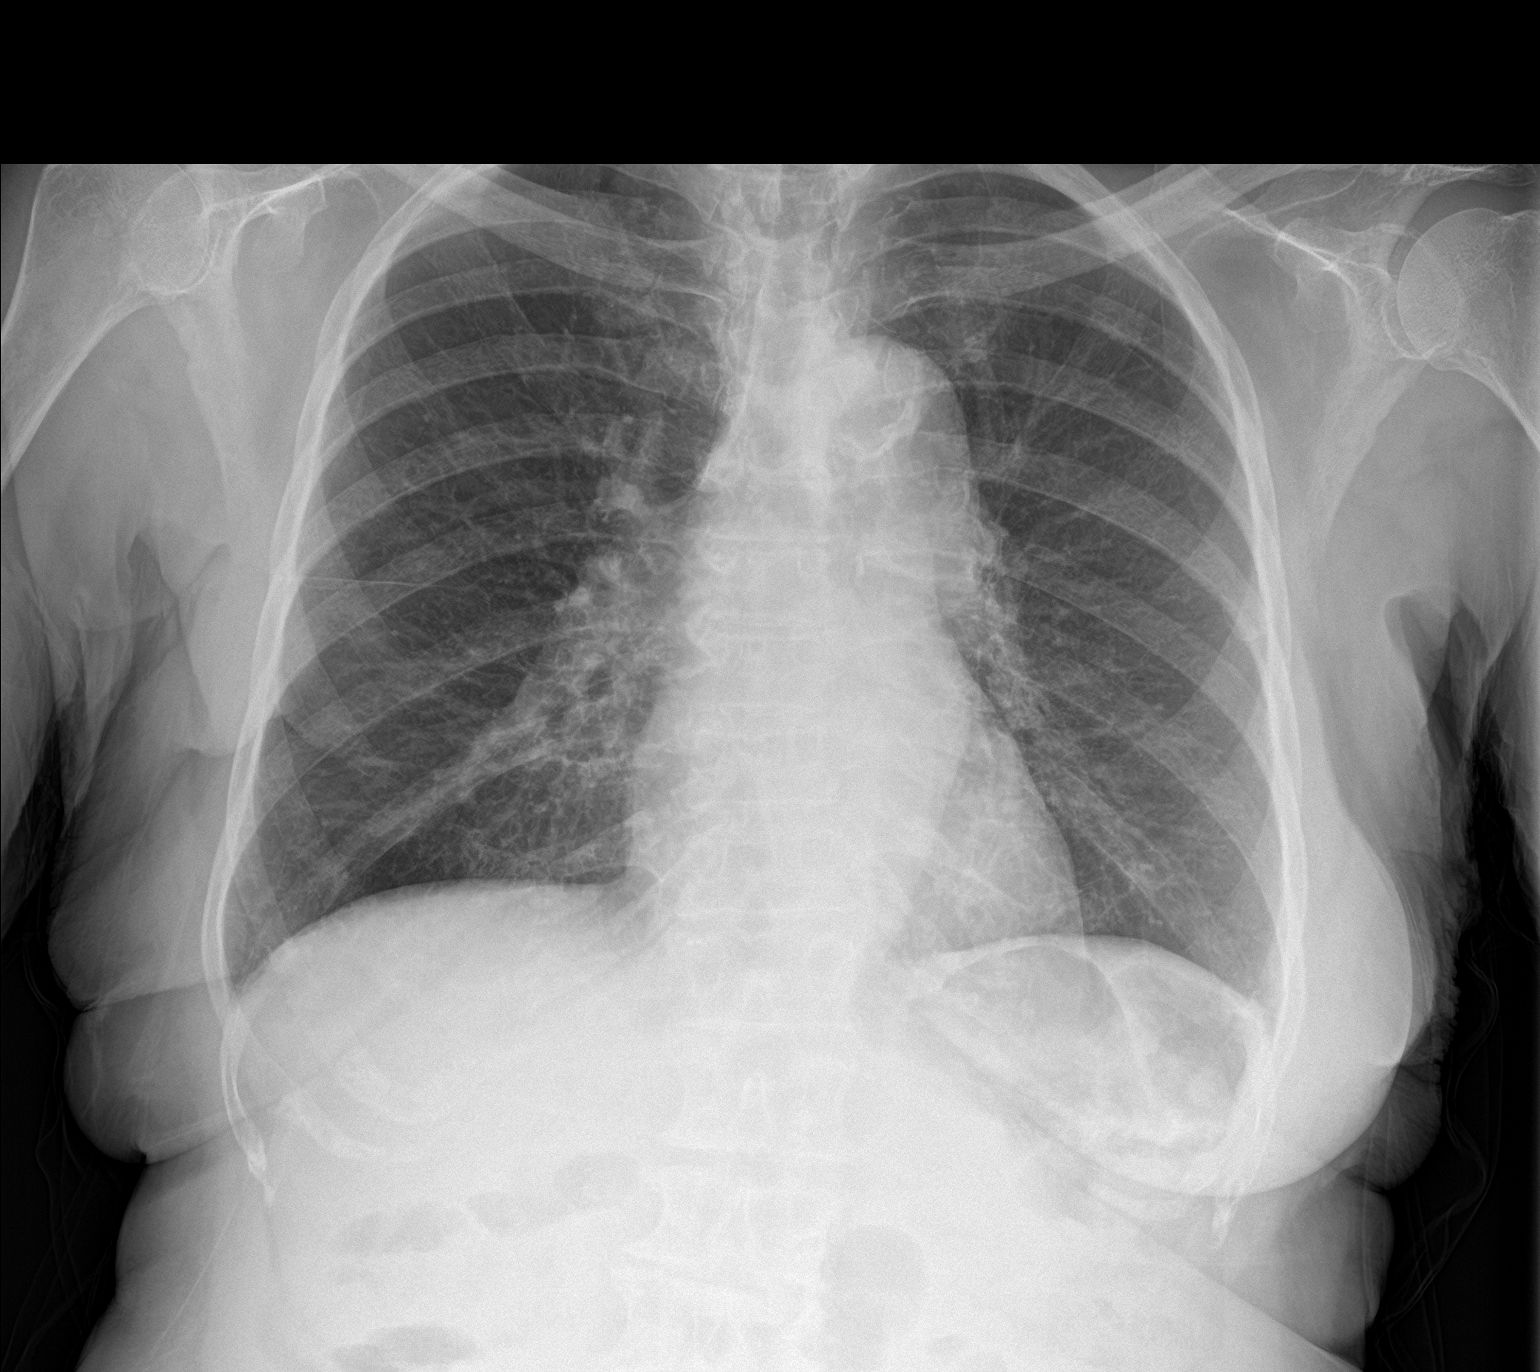

[chest lat]
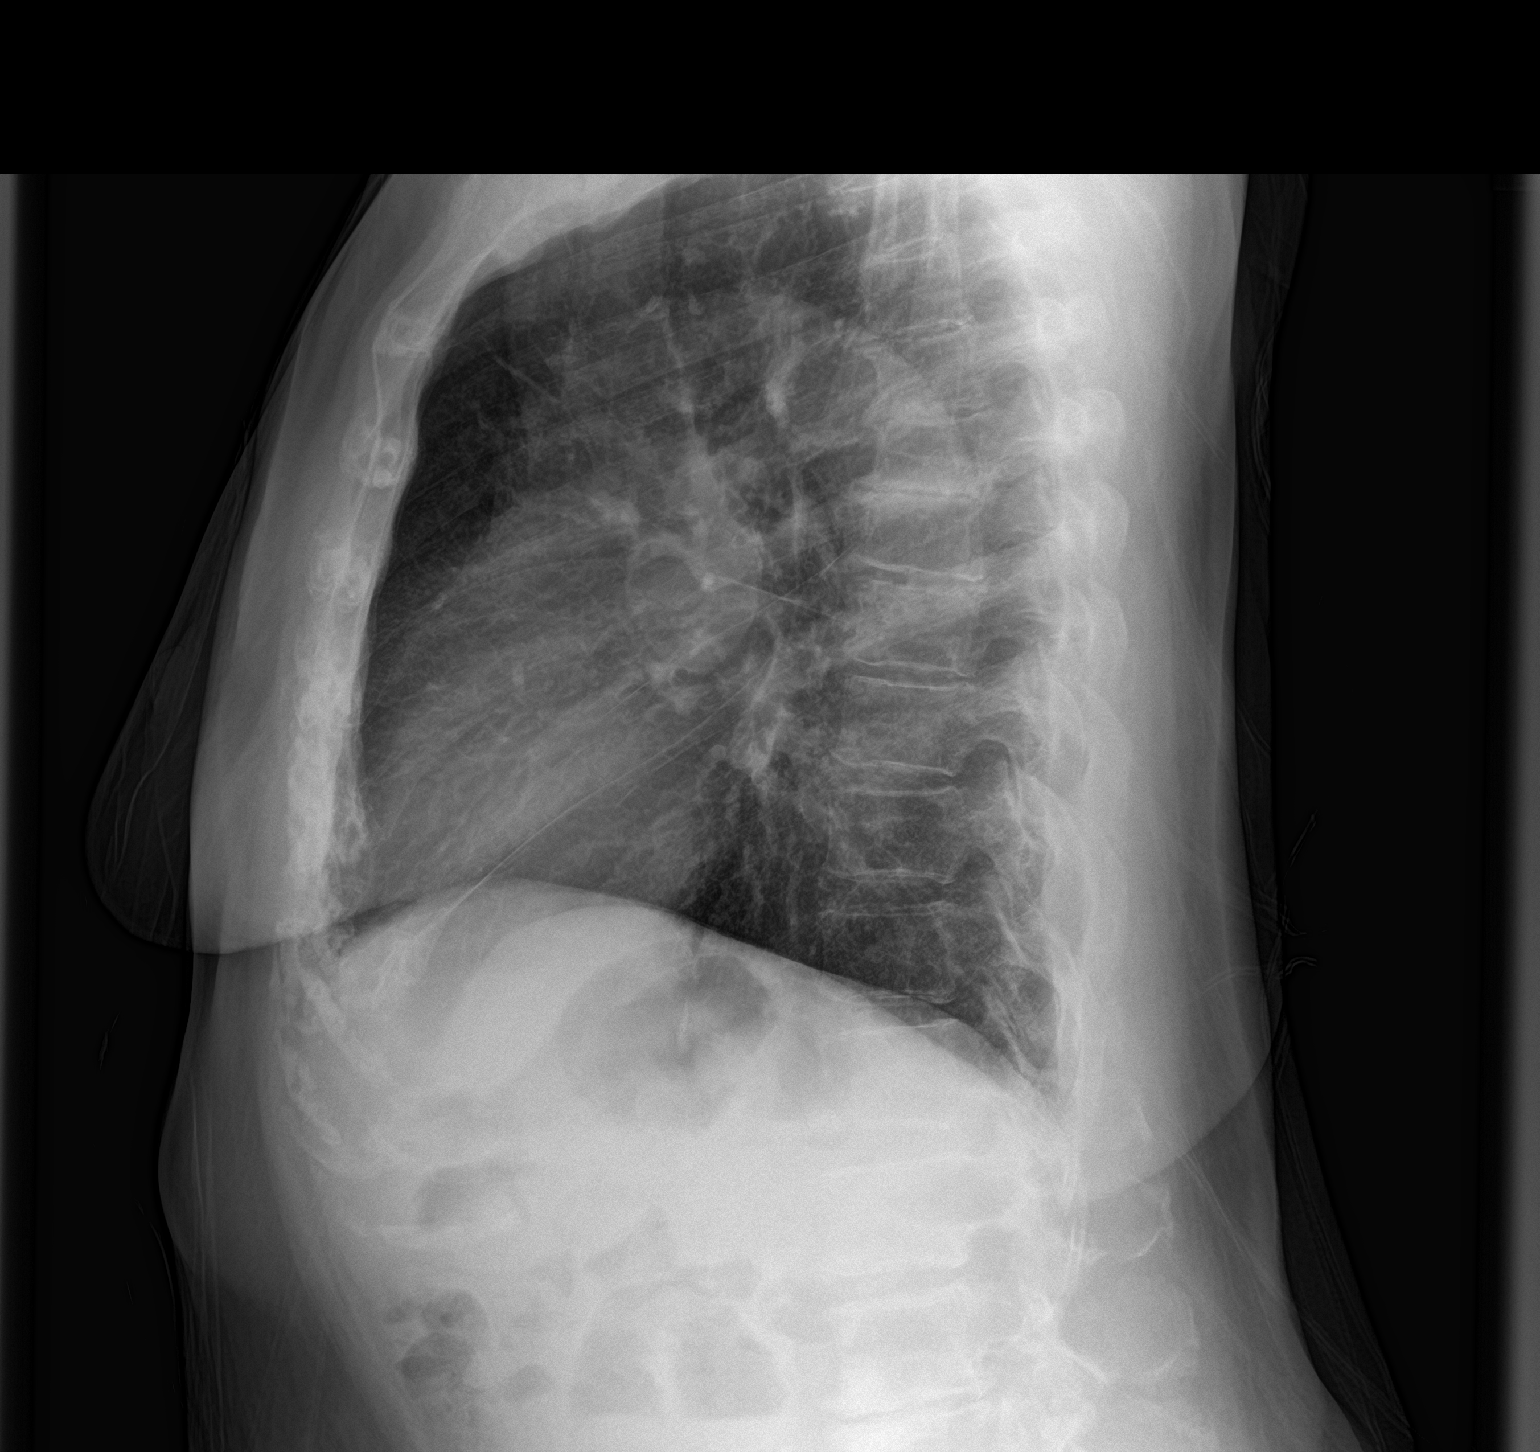

[2 of 2 positions shown; findings below may reference images not displayed]

FINDINGS: The cardiopericardial silhouette is unremarkable.

Fullness of the right hilum is unchanged from 9003.

COPD/emphysema changes noted.

There is no evidence of focal airspace disease, pulmonary edema,
suspicious pulmonary nodule/mass, pleural effusion, or pneumothorax.
No acute bony abnormalities are identified.
IMPRESSION: COPD/emphysema without evidence of acute cardiopulmonary disease.

## 2017-12-02 ENCOUNTER — Ambulatory Visit: Payer: BC Managed Care – PPO | Admitting: Podiatry

## 2017-12-19 ENCOUNTER — Ambulatory Visit: Payer: BC Managed Care – PPO | Admitting: Podiatry

## 2017-12-26 DIAGNOSIS — I10 Essential (primary) hypertension: Secondary | ICD-10-CM | POA: Diagnosis not present

## 2017-12-26 DIAGNOSIS — M1990 Unspecified osteoarthritis, unspecified site: Secondary | ICD-10-CM | POA: Diagnosis not present

## 2017-12-26 DIAGNOSIS — J449 Chronic obstructive pulmonary disease, unspecified: Secondary | ICD-10-CM | POA: Diagnosis not present

## 2017-12-26 DIAGNOSIS — M810 Age-related osteoporosis without current pathological fracture: Secondary | ICD-10-CM | POA: Diagnosis not present

## 2018-01-22 ENCOUNTER — Other Ambulatory Visit: Payer: Self-pay | Admitting: Internal Medicine

## 2018-01-22 DIAGNOSIS — Z1389 Encounter for screening for other disorder: Secondary | ICD-10-CM | POA: Diagnosis not present

## 2018-01-22 DIAGNOSIS — C50911 Malignant neoplasm of unspecified site of right female breast: Secondary | ICD-10-CM | POA: Diagnosis not present

## 2018-01-22 DIAGNOSIS — Z139 Encounter for screening, unspecified: Secondary | ICD-10-CM

## 2018-01-22 DIAGNOSIS — F419 Anxiety disorder, unspecified: Secondary | ICD-10-CM | POA: Diagnosis not present

## 2018-01-22 DIAGNOSIS — E1122 Type 2 diabetes mellitus with diabetic chronic kidney disease: Secondary | ICD-10-CM | POA: Diagnosis not present

## 2018-01-26 ENCOUNTER — Ambulatory Visit
Admission: RE | Admit: 2018-01-26 | Discharge: 2018-01-26 | Disposition: A | Discharge status: Home / Self Care | Payer: Medicare Other | Source: Ambulatory Visit | Attending: Internal Medicine | Admitting: Internal Medicine

## 2018-01-26 DIAGNOSIS — Z1231 Encounter for screening mammogram for malignant neoplasm of breast: Secondary | ICD-10-CM | POA: Diagnosis not present

## 2018-01-26 DIAGNOSIS — M25562 Pain in left knee: Secondary | ICD-10-CM | POA: Diagnosis not present

## 2018-01-26 DIAGNOSIS — Z139 Encounter for screening, unspecified: Secondary | ICD-10-CM

## 2018-01-26 DIAGNOSIS — M1712 Unilateral primary osteoarthritis, left knee: Secondary | ICD-10-CM | POA: Diagnosis not present

## 2018-01-26 HISTORY — DX: Malignant neoplasm of unspecified site of unspecified female breast: C50.919

## 2018-02-26 DIAGNOSIS — M810 Age-related osteoporosis without current pathological fracture: Secondary | ICD-10-CM | POA: Diagnosis not present

## 2018-02-26 DIAGNOSIS — I10 Essential (primary) hypertension: Secondary | ICD-10-CM | POA: Diagnosis not present

## 2018-02-26 DIAGNOSIS — M1990 Unspecified osteoarthritis, unspecified site: Secondary | ICD-10-CM | POA: Diagnosis not present

## 2018-02-26 DIAGNOSIS — J449 Chronic obstructive pulmonary disease, unspecified: Secondary | ICD-10-CM | POA: Diagnosis not present

## 2018-05-05 DIAGNOSIS — I739 Peripheral vascular disease, unspecified: Secondary | ICD-10-CM | POA: Diagnosis not present

## 2018-05-05 DIAGNOSIS — E1122 Type 2 diabetes mellitus with diabetic chronic kidney disease: Secondary | ICD-10-CM | POA: Diagnosis not present

## 2018-05-05 DIAGNOSIS — C50911 Malignant neoplasm of unspecified site of right female breast: Secondary | ICD-10-CM | POA: Diagnosis not present

## 2018-05-05 DIAGNOSIS — F0390 Unspecified dementia without behavioral disturbance: Secondary | ICD-10-CM | POA: Diagnosis not present

## 2018-05-19 NOTE — Progress Notes (Signed)
This encounter was created in error - please disregard.

## 2018-05-19 NOTE — Addendum Note (Signed)
Addended by: Virgel Manifold on: 05/19/2018 03:43 PM   Modules accepted: Level of Service, SmartSet

## 2018-05-26 DIAGNOSIS — I10 Essential (primary) hypertension: Secondary | ICD-10-CM | POA: Diagnosis not present

## 2018-05-26 DIAGNOSIS — N182 Chronic kidney disease, stage 2 (mild): Secondary | ICD-10-CM | POA: Diagnosis not present

## 2018-05-26 DIAGNOSIS — F0390 Unspecified dementia without behavioral disturbance: Secondary | ICD-10-CM | POA: Diagnosis not present

## 2018-05-26 DIAGNOSIS — F0280 Dementia in other diseases classified elsewhere without behavioral disturbance: Secondary | ICD-10-CM | POA: Diagnosis not present

## 2018-05-26 DIAGNOSIS — I129 Hypertensive chronic kidney disease with stage 1 through stage 4 chronic kidney disease, or unspecified chronic kidney disease: Secondary | ICD-10-CM | POA: Diagnosis not present

## 2018-05-26 DIAGNOSIS — M810 Age-related osteoporosis without current pathological fracture: Secondary | ICD-10-CM | POA: Diagnosis not present

## 2018-05-26 DIAGNOSIS — J449 Chronic obstructive pulmonary disease, unspecified: Secondary | ICD-10-CM | POA: Diagnosis not present

## 2018-05-26 DIAGNOSIS — E1122 Type 2 diabetes mellitus with diabetic chronic kidney disease: Secondary | ICD-10-CM | POA: Diagnosis not present

## 2018-05-26 DIAGNOSIS — G309 Alzheimer's disease, unspecified: Secondary | ICD-10-CM | POA: Diagnosis not present

## 2018-08-07 DIAGNOSIS — J449 Chronic obstructive pulmonary disease, unspecified: Secondary | ICD-10-CM | POA: Diagnosis not present

## 2018-08-07 DIAGNOSIS — C50911 Malignant neoplasm of unspecified site of right female breast: Secondary | ICD-10-CM | POA: Diagnosis not present

## 2018-08-07 DIAGNOSIS — E1122 Type 2 diabetes mellitus with diabetic chronic kidney disease: Secondary | ICD-10-CM | POA: Diagnosis not present

## 2018-08-07 DIAGNOSIS — I10 Essential (primary) hypertension: Secondary | ICD-10-CM | POA: Diagnosis not present

## 2018-08-13 DIAGNOSIS — E119 Type 2 diabetes mellitus without complications: Secondary | ICD-10-CM | POA: Diagnosis not present

## 2018-11-12 DIAGNOSIS — I10 Essential (primary) hypertension: Secondary | ICD-10-CM | POA: Diagnosis not present

## 2018-11-12 DIAGNOSIS — E1122 Type 2 diabetes mellitus with diabetic chronic kidney disease: Secondary | ICD-10-CM | POA: Diagnosis not present

## 2018-11-12 DIAGNOSIS — K219 Gastro-esophageal reflux disease without esophagitis: Secondary | ICD-10-CM | POA: Diagnosis not present

## 2018-11-12 DIAGNOSIS — C50911 Malignant neoplasm of unspecified site of right female breast: Secondary | ICD-10-CM | POA: Diagnosis not present

## 2018-11-17 DIAGNOSIS — M1712 Unilateral primary osteoarthritis, left knee: Secondary | ICD-10-CM | POA: Diagnosis not present

## 2018-11-17 DIAGNOSIS — M25562 Pain in left knee: Secondary | ICD-10-CM | POA: Diagnosis not present

## 2018-12-15 DIAGNOSIS — I10 Essential (primary) hypertension: Secondary | ICD-10-CM | POA: Diagnosis not present

## 2018-12-15 DIAGNOSIS — M1990 Unspecified osteoarthritis, unspecified site: Secondary | ICD-10-CM | POA: Diagnosis not present

## 2018-12-15 DIAGNOSIS — J449 Chronic obstructive pulmonary disease, unspecified: Secondary | ICD-10-CM | POA: Diagnosis not present

## 2018-12-15 DIAGNOSIS — M810 Age-related osteoporosis without current pathological fracture: Secondary | ICD-10-CM | POA: Diagnosis not present

## 2018-12-28 DIAGNOSIS — I10 Essential (primary) hypertension: Secondary | ICD-10-CM | POA: Diagnosis not present

## 2019-03-29 DIAGNOSIS — I739 Peripheral vascular disease, unspecified: Secondary | ICD-10-CM | POA: Diagnosis not present

## 2019-03-29 DIAGNOSIS — E1122 Type 2 diabetes mellitus with diabetic chronic kidney disease: Secondary | ICD-10-CM | POA: Diagnosis not present

## 2019-03-29 DIAGNOSIS — C50911 Malignant neoplasm of unspecified site of right female breast: Secondary | ICD-10-CM | POA: Diagnosis not present

## 2019-03-29 DIAGNOSIS — I10 Essential (primary) hypertension: Secondary | ICD-10-CM | POA: Diagnosis not present

## 2019-12-07 DIAGNOSIS — M810 Age-related osteoporosis without current pathological fracture: Secondary | ICD-10-CM | POA: Diagnosis not present

## 2019-12-07 DIAGNOSIS — I10 Essential (primary) hypertension: Secondary | ICD-10-CM | POA: Diagnosis not present

## 2019-12-07 DIAGNOSIS — J449 Chronic obstructive pulmonary disease, unspecified: Secondary | ICD-10-CM | POA: Diagnosis not present

## 2019-12-07 DIAGNOSIS — M1990 Unspecified osteoarthritis, unspecified site: Secondary | ICD-10-CM | POA: Diagnosis not present

## 2020-01-06 DIAGNOSIS — J449 Chronic obstructive pulmonary disease, unspecified: Secondary | ICD-10-CM | POA: Diagnosis not present

## 2020-01-06 DIAGNOSIS — E1122 Type 2 diabetes mellitus with diabetic chronic kidney disease: Secondary | ICD-10-CM | POA: Diagnosis not present

## 2020-01-06 DIAGNOSIS — I10 Essential (primary) hypertension: Secondary | ICD-10-CM | POA: Diagnosis not present

## 2020-01-06 DIAGNOSIS — Z1389 Encounter for screening for other disorder: Secondary | ICD-10-CM | POA: Diagnosis not present

## 2020-01-07 DIAGNOSIS — M5136 Other intervertebral disc degeneration, lumbar region: Secondary | ICD-10-CM | POA: Diagnosis not present

## 2020-01-07 DIAGNOSIS — R269 Unspecified abnormalities of gait and mobility: Secondary | ICD-10-CM | POA: Diagnosis not present

## 2020-01-07 DIAGNOSIS — M810 Age-related osteoporosis without current pathological fracture: Secondary | ICD-10-CM | POA: Diagnosis not present

## 2020-01-07 DIAGNOSIS — M1990 Unspecified osteoarthritis, unspecified site: Secondary | ICD-10-CM | POA: Diagnosis not present

## 2020-04-14 DIAGNOSIS — F0390 Unspecified dementia without behavioral disturbance: Secondary | ICD-10-CM | POA: Diagnosis not present

## 2020-04-14 DIAGNOSIS — I10 Essential (primary) hypertension: Secondary | ICD-10-CM | POA: Diagnosis not present

## 2020-04-14 DIAGNOSIS — E1122 Type 2 diabetes mellitus with diabetic chronic kidney disease: Secondary | ICD-10-CM | POA: Diagnosis not present

## 2020-04-14 DIAGNOSIS — I739 Peripheral vascular disease, unspecified: Secondary | ICD-10-CM | POA: Diagnosis not present

## 2020-04-17 ENCOUNTER — Other Ambulatory Visit: Payer: Self-pay | Admitting: Internal Medicine

## 2020-04-17 DIAGNOSIS — R634 Abnormal weight loss: Secondary | ICD-10-CM

## 2020-05-03 ENCOUNTER — Other Ambulatory Visit: Payer: Self-pay

## 2020-05-03 ENCOUNTER — Ambulatory Visit
Admission: RE | Admit: 2020-05-03 | Discharge: 2020-05-03 | Disposition: A | Discharge status: Home / Self Care | Payer: Medicare Other | Source: Ambulatory Visit | Attending: Internal Medicine | Admitting: Internal Medicine

## 2020-05-03 DIAGNOSIS — R634 Abnormal weight loss: Secondary | ICD-10-CM | POA: Diagnosis not present

## 2020-05-03 DIAGNOSIS — J9811 Atelectasis: Secondary | ICD-10-CM | POA: Diagnosis not present

## 2020-05-03 DIAGNOSIS — K838 Other specified diseases of biliary tract: Secondary | ICD-10-CM | POA: Diagnosis not present

## 2020-05-03 DIAGNOSIS — J439 Emphysema, unspecified: Secondary | ICD-10-CM | POA: Diagnosis not present

## 2020-05-03 MED ORDER — IOPAMIDOL (ISOVUE-300) INJECTION 61%
100.0000 mL | Freq: Once | INTRAVENOUS | Status: AC | PRN
  Administered 2020-05-03: 100 mL via INTRAVENOUS

## 2020-06-23 DIAGNOSIS — E1122 Type 2 diabetes mellitus with diabetic chronic kidney disease: Secondary | ICD-10-CM | POA: Diagnosis not present

## 2020-06-23 DIAGNOSIS — I10 Essential (primary) hypertension: Secondary | ICD-10-CM | POA: Diagnosis not present

## 2020-06-23 DIAGNOSIS — F0390 Unspecified dementia without behavioral disturbance: Secondary | ICD-10-CM | POA: Diagnosis not present

## 2020-06-23 DIAGNOSIS — I739 Peripheral vascular disease, unspecified: Secondary | ICD-10-CM | POA: Diagnosis not present

## 2020-09-25 DIAGNOSIS — I739 Peripheral vascular disease, unspecified: Secondary | ICD-10-CM | POA: Diagnosis not present

## 2020-09-25 DIAGNOSIS — E1122 Type 2 diabetes mellitus with diabetic chronic kidney disease: Secondary | ICD-10-CM | POA: Diagnosis not present

## 2020-09-25 DIAGNOSIS — E46 Unspecified protein-calorie malnutrition: Secondary | ICD-10-CM | POA: Diagnosis not present

## 2020-09-25 DIAGNOSIS — I10 Essential (primary) hypertension: Secondary | ICD-10-CM | POA: Diagnosis not present

## 2020-10-25 DIAGNOSIS — H52223 Regular astigmatism, bilateral: Secondary | ICD-10-CM | POA: Diagnosis not present

## 2021-01-18 DIAGNOSIS — Z0000 Encounter for general adult medical examination without abnormal findings: Secondary | ICD-10-CM | POA: Diagnosis not present

## 2021-01-18 DIAGNOSIS — I739 Peripheral vascular disease, unspecified: Secondary | ICD-10-CM | POA: Diagnosis not present

## 2021-01-18 DIAGNOSIS — E1122 Type 2 diabetes mellitus with diabetic chronic kidney disease: Secondary | ICD-10-CM | POA: Diagnosis not present

## 2021-01-18 DIAGNOSIS — I10 Essential (primary) hypertension: Secondary | ICD-10-CM | POA: Diagnosis not present

## 2021-02-16 ENCOUNTER — Other Ambulatory Visit: Payer: Self-pay

## 2021-02-16 ENCOUNTER — Ambulatory Visit (INDEPENDENT_AMBULATORY_CARE_PROVIDER_SITE_OTHER): Payer: Medicare Other | Admitting: Podiatry

## 2021-02-16 ENCOUNTER — Encounter: Payer: Self-pay | Admitting: Podiatry

## 2021-02-16 DIAGNOSIS — E1159 Type 2 diabetes mellitus with other circulatory complications: Secondary | ICD-10-CM | POA: Insufficient documentation

## 2021-02-16 DIAGNOSIS — B351 Tinea unguium: Secondary | ICD-10-CM | POA: Diagnosis not present

## 2021-02-16 DIAGNOSIS — N181 Chronic kidney disease, stage 1: Secondary | ICD-10-CM

## 2021-02-16 DIAGNOSIS — M79675 Pain in left toe(s): Secondary | ICD-10-CM

## 2021-02-16 DIAGNOSIS — M79674 Pain in right toe(s): Secondary | ICD-10-CM | POA: Diagnosis not present

## 2021-02-16 NOTE — Progress Notes (Signed)
This patient returns to my office for at risk foot care.  This patient requires this care by a professional since this patient will be at risk due to having type 2 diabetes and CKD.  She presents to the office with her son.  This patient is unable to cut nails herself since the patient cannot reach her nails.These nails are painful walking and wearing shoes.  This patient presents for at risk foot care today.  General Appearance  Alert, conversant and in no acute stress.  Vascular  Dorsalis pedis and posterior tibial  pulses are weakly  palpable  bilaterally.  Capillary return is within normal limits  bilaterally. Cold feetbilaterally. Absent digital hair  B/L.  Neurologic  Senn-Weinstein monofilament wire test within normal limits  bilaterally. Muscle power within normal limits bilaterally.  Nails Thick disfigured discolored nails with subungual debris  from hallux to fifth toes bilaterally. No evidence of bacterial infection or drainage bilaterally.  Orthopedic  No limitations of motion  feet .  No crepitus or effusions noted.  No bony pathology or digital deformities noted.  Skin  normotropic skin with no porokeratosis noted bilaterally.  No signs of infections or ulcers noted.     Onychomycosis  Pain in right toes  Pain in left toes  Consent was obtained for treatment procedures.   Mechanical debridement of nails 1-5  bilaterally performed with a nail nipper.  Filed with dremel without incident.    Return office visit  4 months                   Told patient to return for periodic foot care and evaluation due to potential at risk complications.   Gardiner Barefoot DPM

## 2021-05-23 DIAGNOSIS — F419 Anxiety disorder, unspecified: Secondary | ICD-10-CM | POA: Diagnosis not present

## 2021-05-23 DIAGNOSIS — E1122 Type 2 diabetes mellitus with diabetic chronic kidney disease: Secondary | ICD-10-CM | POA: Diagnosis not present

## 2021-05-23 DIAGNOSIS — M5136 Other intervertebral disc degeneration, lumbar region: Secondary | ICD-10-CM | POA: Diagnosis not present

## 2021-05-23 DIAGNOSIS — I10 Essential (primary) hypertension: Secondary | ICD-10-CM | POA: Diagnosis not present

## 2021-06-19 ENCOUNTER — Ambulatory Visit: Payer: Medicare Other | Admitting: Podiatry

## 2021-08-20 DIAGNOSIS — N182 Chronic kidney disease, stage 2 (mild): Secondary | ICD-10-CM | POA: Diagnosis not present

## 2021-08-20 DIAGNOSIS — I739 Peripheral vascular disease, unspecified: Secondary | ICD-10-CM | POA: Diagnosis not present

## 2021-08-20 DIAGNOSIS — E1122 Type 2 diabetes mellitus with diabetic chronic kidney disease: Secondary | ICD-10-CM | POA: Diagnosis not present

## 2021-08-20 DIAGNOSIS — I10 Essential (primary) hypertension: Secondary | ICD-10-CM | POA: Diagnosis not present

## 2021-10-30 DIAGNOSIS — H5203 Hypermetropia, bilateral: Secondary | ICD-10-CM | POA: Diagnosis not present

## 2021-12-30 DEATH — deceased
